# Patient Record
Sex: Male | Born: 1940
Health system: Southern US, Community
[De-identification: ages and names within clinical notes are randomized; demographics above are authoritative.]

## PROBLEM LIST (undated history)

## (undated) DIAGNOSIS — F0390 Unspecified dementia without behavioral disturbance: Secondary | ICD-10-CM

## (undated) DIAGNOSIS — I219 Acute myocardial infarction, unspecified: Secondary | ICD-10-CM

## (undated) DIAGNOSIS — I10 Essential (primary) hypertension: Secondary | ICD-10-CM

## (undated) HISTORY — DX: Acute myocardial infarction, unspecified: I21.9

---

## 2016-06-24 DIAGNOSIS — I10 Essential (primary) hypertension: Secondary | ICD-10-CM | POA: Diagnosis not present

## 2016-06-24 DIAGNOSIS — Z6829 Body mass index (BMI) 29.0-29.9, adult: Secondary | ICD-10-CM | POA: Diagnosis not present

## 2016-06-24 DIAGNOSIS — E785 Hyperlipidemia, unspecified: Secondary | ICD-10-CM | POA: Diagnosis not present

## 2016-06-24 DIAGNOSIS — F0390 Unspecified dementia without behavioral disturbance: Secondary | ICD-10-CM | POA: Diagnosis not present

## 2016-06-24 DIAGNOSIS — Z299 Encounter for prophylactic measures, unspecified: Secondary | ICD-10-CM | POA: Diagnosis not present

## 2016-08-04 DIAGNOSIS — K219 Gastro-esophageal reflux disease without esophagitis: Secondary | ICD-10-CM | POA: Diagnosis not present

## 2016-08-04 DIAGNOSIS — Z299 Encounter for prophylactic measures, unspecified: Secondary | ICD-10-CM | POA: Diagnosis not present

## 2016-08-04 DIAGNOSIS — R21 Rash and other nonspecific skin eruption: Secondary | ICD-10-CM | POA: Diagnosis not present

## 2016-08-04 DIAGNOSIS — E785 Hyperlipidemia, unspecified: Secondary | ICD-10-CM | POA: Diagnosis not present

## 2016-08-04 DIAGNOSIS — E663 Overweight: Secondary | ICD-10-CM | POA: Diagnosis not present

## 2016-08-04 DIAGNOSIS — F0390 Unspecified dementia without behavioral disturbance: Secondary | ICD-10-CM | POA: Diagnosis not present

## 2016-11-04 DIAGNOSIS — E785 Hyperlipidemia, unspecified: Secondary | ICD-10-CM | POA: Diagnosis not present

## 2016-11-04 DIAGNOSIS — Z299 Encounter for prophylactic measures, unspecified: Secondary | ICD-10-CM | POA: Diagnosis not present

## 2016-11-04 DIAGNOSIS — I10 Essential (primary) hypertension: Secondary | ICD-10-CM | POA: Diagnosis not present

## 2016-11-04 DIAGNOSIS — F0390 Unspecified dementia without behavioral disturbance: Secondary | ICD-10-CM | POA: Diagnosis not present

## 2016-11-04 DIAGNOSIS — Z789 Other specified health status: Secondary | ICD-10-CM | POA: Diagnosis not present

## 2016-11-04 DIAGNOSIS — Z6828 Body mass index (BMI) 28.0-28.9, adult: Secondary | ICD-10-CM | POA: Diagnosis not present

## 2016-12-06 DIAGNOSIS — Z6828 Body mass index (BMI) 28.0-28.9, adult: Secondary | ICD-10-CM | POA: Diagnosis not present

## 2016-12-06 DIAGNOSIS — I10 Essential (primary) hypertension: Secondary | ICD-10-CM | POA: Diagnosis not present

## 2016-12-06 DIAGNOSIS — L0291 Cutaneous abscess, unspecified: Secondary | ICD-10-CM | POA: Diagnosis not present

## 2016-12-06 DIAGNOSIS — E785 Hyperlipidemia, unspecified: Secondary | ICD-10-CM | POA: Diagnosis not present

## 2016-12-06 DIAGNOSIS — F0390 Unspecified dementia without behavioral disturbance: Secondary | ICD-10-CM | POA: Diagnosis not present

## 2016-12-06 DIAGNOSIS — Z299 Encounter for prophylactic measures, unspecified: Secondary | ICD-10-CM | POA: Diagnosis not present

## 2016-12-15 DIAGNOSIS — I10 Essential (primary) hypertension: Secondary | ICD-10-CM | POA: Diagnosis not present

## 2016-12-15 DIAGNOSIS — L02413 Cutaneous abscess of right upper limb: Secondary | ICD-10-CM | POA: Diagnosis not present

## 2016-12-17 DIAGNOSIS — L02413 Cutaneous abscess of right upper limb: Secondary | ICD-10-CM | POA: Diagnosis not present

## 2016-12-17 DIAGNOSIS — Z4800 Encounter for change or removal of nonsurgical wound dressing: Secondary | ICD-10-CM | POA: Diagnosis not present

## 2016-12-17 DIAGNOSIS — Z48817 Encounter for surgical aftercare following surgery on the skin and subcutaneous tissue: Secondary | ICD-10-CM | POA: Diagnosis not present

## 2017-03-06 DIAGNOSIS — Z6829 Body mass index (BMI) 29.0-29.9, adult: Secondary | ICD-10-CM | POA: Diagnosis not present

## 2017-03-06 DIAGNOSIS — F0280 Dementia in other diseases classified elsewhere without behavioral disturbance: Secondary | ICD-10-CM | POA: Diagnosis not present

## 2017-03-06 DIAGNOSIS — I10 Essential (primary) hypertension: Secondary | ICD-10-CM | POA: Diagnosis not present

## 2017-03-06 DIAGNOSIS — G309 Alzheimer's disease, unspecified: Secondary | ICD-10-CM | POA: Diagnosis not present

## 2017-03-06 DIAGNOSIS — Z299 Encounter for prophylactic measures, unspecified: Secondary | ICD-10-CM | POA: Diagnosis not present

## 2017-03-06 DIAGNOSIS — Z2821 Immunization not carried out because of patient refusal: Secondary | ICD-10-CM | POA: Diagnosis not present

## 2017-03-06 DIAGNOSIS — E785 Hyperlipidemia, unspecified: Secondary | ICD-10-CM | POA: Diagnosis not present

## 2017-03-06 DIAGNOSIS — F439 Reaction to severe stress, unspecified: Secondary | ICD-10-CM | POA: Diagnosis not present

## 2017-03-06 DIAGNOSIS — Z87891 Personal history of nicotine dependence: Secondary | ICD-10-CM | POA: Diagnosis not present

## 2017-06-06 DIAGNOSIS — I10 Essential (primary) hypertension: Secondary | ICD-10-CM | POA: Diagnosis not present

## 2017-06-06 DIAGNOSIS — G309 Alzheimer's disease, unspecified: Secondary | ICD-10-CM | POA: Diagnosis not present

## 2017-06-06 DIAGNOSIS — F0280 Dementia in other diseases classified elsewhere without behavioral disturbance: Secondary | ICD-10-CM | POA: Diagnosis not present

## 2017-06-06 DIAGNOSIS — Z299 Encounter for prophylactic measures, unspecified: Secondary | ICD-10-CM | POA: Diagnosis not present

## 2017-06-06 DIAGNOSIS — Z6829 Body mass index (BMI) 29.0-29.9, adult: Secondary | ICD-10-CM | POA: Diagnosis not present

## 2018-01-30 ENCOUNTER — Encounter (HOSPITAL_COMMUNITY)
Admission: RE | Disposition: A | Discharge status: Home / Self Care | Payer: Self-pay | Source: Home / Self Care | Attending: Cardiology

## 2018-01-30 ENCOUNTER — Encounter (HOSPITAL_COMMUNITY): Payer: Self-pay | Admitting: Internal Medicine

## 2018-01-30 ENCOUNTER — Inpatient Hospital Stay (HOSPITAL_COMMUNITY)
Admission: RE | Admit: 2018-01-30 | Discharge: 2018-02-02 | DRG: 250 | Disposition: A | Discharge status: Home / Self Care | Payer: Medicare Other | Attending: Cardiology | Admitting: Cardiology

## 2018-01-30 DIAGNOSIS — I255 Ischemic cardiomyopathy: Secondary | ICD-10-CM | POA: Diagnosis present

## 2018-01-30 DIAGNOSIS — F0390 Unspecified dementia without behavioral disturbance: Secondary | ICD-10-CM | POA: Diagnosis present

## 2018-01-30 DIAGNOSIS — I2119 ST elevation (STEMI) myocardial infarction involving other coronary artery of inferior wall: Principal | ICD-10-CM | POA: Diagnosis present

## 2018-01-30 DIAGNOSIS — T445X5A Adverse effect of predominantly beta-adrenoreceptor agonists, initial encounter: Secondary | ICD-10-CM | POA: Diagnosis not present

## 2018-01-30 DIAGNOSIS — R0689 Other abnormalities of breathing: Secondary | ICD-10-CM | POA: Diagnosis not present

## 2018-01-30 DIAGNOSIS — R21 Rash and other nonspecific skin eruption: Secondary | ICD-10-CM | POA: Diagnosis present

## 2018-01-30 DIAGNOSIS — E785 Hyperlipidemia, unspecified: Secondary | ICD-10-CM | POA: Diagnosis present

## 2018-01-30 DIAGNOSIS — I10 Essential (primary) hypertension: Secondary | ICD-10-CM | POA: Diagnosis not present

## 2018-01-30 DIAGNOSIS — R0902 Hypoxemia: Secondary | ICD-10-CM | POA: Diagnosis not present

## 2018-01-30 DIAGNOSIS — I472 Ventricular tachycardia: Secondary | ICD-10-CM | POA: Diagnosis present

## 2018-01-30 DIAGNOSIS — R0789 Other chest pain: Secondary | ICD-10-CM | POA: Diagnosis not present

## 2018-01-30 DIAGNOSIS — I959 Hypotension, unspecified: Secondary | ICD-10-CM | POA: Diagnosis not present

## 2018-01-30 DIAGNOSIS — I25119 Atherosclerotic heart disease of native coronary artery with unspecified angina pectoris: Secondary | ICD-10-CM | POA: Diagnosis present

## 2018-01-30 DIAGNOSIS — I110 Hypertensive heart disease with heart failure: Secondary | ICD-10-CM | POA: Diagnosis present

## 2018-01-30 DIAGNOSIS — I5021 Acute systolic (congestive) heart failure: Secondary | ICD-10-CM | POA: Diagnosis present

## 2018-01-30 DIAGNOSIS — Z9861 Coronary angioplasty status: Secondary | ICD-10-CM

## 2018-01-30 DIAGNOSIS — Z87891 Personal history of nicotine dependence: Secondary | ICD-10-CM | POA: Diagnosis not present

## 2018-01-30 DIAGNOSIS — I4729 Other ventricular tachycardia: Secondary | ICD-10-CM

## 2018-01-30 DIAGNOSIS — I2111 ST elevation (STEMI) myocardial infarction involving right coronary artery: Secondary | ICD-10-CM | POA: Diagnosis not present

## 2018-01-30 DIAGNOSIS — I2510 Atherosclerotic heart disease of native coronary artery without angina pectoris: Secondary | ICD-10-CM | POA: Clinically undetermined

## 2018-01-30 DIAGNOSIS — I213 ST elevation (STEMI) myocardial infarction of unspecified site: Secondary | ICD-10-CM | POA: Diagnosis not present

## 2018-01-30 DIAGNOSIS — I2109 ST elevation (STEMI) myocardial infarction involving other coronary artery of anterior wall: Secondary | ICD-10-CM | POA: Diagnosis not present

## 2018-01-30 DIAGNOSIS — R079 Chest pain, unspecified: Secondary | ICD-10-CM | POA: Diagnosis not present

## 2018-01-30 HISTORY — DX: Unspecified dementia, unspecified severity, without behavioral disturbance, psychotic disturbance, mood disturbance, and anxiety: F03.90

## 2018-01-30 HISTORY — PX: LEFT HEART CATH AND CORONARY ANGIOGRAPHY: CATH118249

## 2018-01-30 HISTORY — DX: Essential (primary) hypertension: I10

## 2018-01-30 HISTORY — PX: CORONARY/GRAFT ACUTE MI REVASCULARIZATION: CATH118305

## 2018-01-30 LAB — HEMOGLOBIN A1C
HEMOGLOBIN A1C: 5.3 % (ref 4.8–5.6)
Mean Plasma Glucose: 105.41 mg/dL

## 2018-01-30 LAB — COMPREHENSIVE METABOLIC PANEL
ALK PHOS: 76 U/L (ref 38–126)
ALT: 21 U/L (ref 0–44)
AST: 22 U/L (ref 15–41)
Albumin: 3.5 g/dL (ref 3.5–5.0)
Anion gap: 9 (ref 5–15)
BUN: 17 mg/dL (ref 8–23)
CO2: 22 mmol/L (ref 22–32)
Calcium: 9.1 mg/dL (ref 8.9–10.3)
Chloride: 106 mmol/L (ref 98–111)
Creatinine, Ser: 0.89 mg/dL (ref 0.61–1.24)
GFR calc Af Amer: >60 mL/min (ref >60)
GFR calc non Af Amer: >60 mL/min (ref >60)
Glucose, Bld: 161 mg/dL — ABNORMAL HIGH (ref 70–99)
Potassium: 3.9 mmol/L (ref 3.5–5.1)
Sodium: 137 mmol/L (ref 135–145)
Total Bilirubin: 0.7 mg/dL (ref 0.3–1.2)
Total Protein: 6.5 g/dL (ref 6.5–8.1)

## 2018-01-30 LAB — PROTIME-INR
INR: 1.16
Prothrombin Time: 14.7 seconds (ref 11.4–15.2)

## 2018-01-30 LAB — CBC
HCT: 43.1 % (ref 39.0–52.0)
Hemoglobin: 14.5 g/dL (ref 13.0–17.0)
MCH: 29.4 pg (ref 26.0–34.0)
MCHC: 33.6 g/dL (ref 30.0–36.0)
MCV: 87.4 fL (ref 80.0–100.0)
Platelets: 185 10*3/uL (ref 150–400)
RBC: 4.93 MIL/uL (ref 4.22–5.81)
RDW: 13 % (ref 11.5–15.5)
WBC: 9.6 10*3/uL (ref 4.0–10.5)
nRBC: 0 % (ref 0.0–0.2)

## 2018-01-30 LAB — LIPID PANEL
Cholesterol: 148 mg/dL (ref 0–200)
HDL: 46 mg/dL (ref >40)
LDL Cholesterol: 96 mg/dL (ref 0–99)
Total CHOL/HDL Ratio: 3.2 RATIO
Triglycerides: 32 mg/dL (ref <150)
VLDL: 6 mg/dL (ref 0–40)

## 2018-01-30 LAB — APTT: aPTT: 100 seconds — ABNORMAL HIGH (ref 24–36)

## 2018-01-30 LAB — TROPONIN I: Troponin I: 0.08 ng/mL (ref <0.03)

## 2018-01-30 SURGERY — CORONARY/GRAFT ACUTE MI REVASCULARIZATION
Anesthesia: LOCAL

## 2018-01-30 MED ORDER — TICAGRELOR 90 MG PO TABS
ORAL_TABLET | ORAL | Status: DC | PRN
  Administered 2018-01-30: 180 mg via ORAL

## 2018-01-30 MED ORDER — LOSARTAN POTASSIUM 50 MG PO TABS
50.0000 mg | ORAL_TABLET | Freq: Every day | ORAL | Status: DC
  Administered 2018-01-31 – 2018-02-01 (×2): 50 mg via ORAL
  Filled 2018-01-30 (×2): qty 1

## 2018-01-30 MED ORDER — ATORVASTATIN CALCIUM 80 MG PO TABS
80.0000 mg | ORAL_TABLET | Freq: Every day | ORAL | Status: DC
  Administered 2018-01-31 – 2018-02-01 (×2): 80 mg via ORAL
  Filled 2018-01-30 (×2): qty 1

## 2018-01-30 MED ORDER — SODIUM CHLORIDE 0.9% FLUSH
3.0000 mL | INTRAVENOUS | Status: DC | PRN
  Administered 2018-01-31: 3 mL via INTRAVENOUS
  Filled 2018-01-30: qty 3

## 2018-01-30 MED ORDER — TIROFIBAN HCL IN NACL 5-0.9 MG/100ML-% IV SOLN
INTRAVENOUS | Status: AC
  Filled 2018-01-30: qty 100

## 2018-01-30 MED ORDER — IOHEXOL 350 MG/ML SOLN
INTRAVENOUS | Status: DC | PRN
  Administered 2018-01-30: 105 mL via INTRA_ARTERIAL

## 2018-01-30 MED ORDER — TIROFIBAN HCL IN NACL 5-0.9 MG/100ML-% IV SOLN
INTRAVENOUS | Status: AC | PRN
  Administered 2018-01-30: 0.15 ug/kg/min via INTRAVENOUS

## 2018-01-30 MED ORDER — METOPROLOL TARTRATE 12.5 MG HALF TABLET
12.5000 mg | ORAL_TABLET | Freq: Two times a day (BID) | ORAL | Status: AC
  Administered 2018-01-30 – 2018-02-01 (×5): 12.5 mg via ORAL
  Filled 2018-01-30 (×5): qty 1

## 2018-01-30 MED ORDER — HEPARIN (PORCINE) IN NACL 1000-0.9 UT/500ML-% IV SOLN
INTRAVENOUS | Status: AC
  Filled 2018-01-30: qty 1000

## 2018-01-30 MED ORDER — FENTANYL CITRATE (PF) 100 MCG/2ML IJ SOLN
INTRAMUSCULAR | Status: AC
  Filled 2018-01-30: qty 2

## 2018-01-30 MED ORDER — ONDANSETRON HCL 4 MG/2ML IJ SOLN
INTRAMUSCULAR | Status: AC
  Filled 2018-01-30: qty 2

## 2018-01-30 MED ORDER — MEMANTINE HCL 10 MG PO TABS
10.0000 mg | ORAL_TABLET | Freq: Every day | ORAL | Status: DC
  Administered 2018-01-31 – 2018-02-02 (×3): 10 mg via ORAL
  Filled 2018-01-30 (×3): qty 1

## 2018-01-30 MED ORDER — FENTANYL CITRATE (PF) 100 MCG/2ML IJ SOLN
INTRAMUSCULAR | Status: DC | PRN
  Administered 2018-01-30: 25 ug via INTRAVENOUS

## 2018-01-30 MED ORDER — NYSTATIN 100000 UNIT/GM EX CREA
TOPICAL_CREAM | Freq: Two times a day (BID) | CUTANEOUS | Status: DC
  Administered 2018-01-30: 1 via TOPICAL
  Administered 2018-01-31: 09:00:00 via TOPICAL
  Filled 2018-01-30: qty 15

## 2018-01-30 MED ORDER — NITROGLYCERIN 1 MG/10 ML FOR IR/CATH LAB
INTRA_ARTERIAL | Status: DC | PRN
  Administered 2018-01-30: 200 ug via INTRACORONARY

## 2018-01-30 MED ORDER — TIROFIBAN (AGGRASTAT) BOLUS VIA INFUSION
INTRAVENOUS | Status: DC | PRN
  Administered 2018-01-30: 2400 ug via INTRAVENOUS

## 2018-01-30 MED ORDER — LIDOCAINE HCL (PF) 1 % IJ SOLN
INTRAMUSCULAR | Status: DC | PRN
  Administered 2018-01-30: 2 mL via INTRADERMAL

## 2018-01-30 MED ORDER — LIDOCAINE HCL (PF) 1 % IJ SOLN
INTRAMUSCULAR | Status: AC
  Filled 2018-01-30: qty 30

## 2018-01-30 MED ORDER — ONDANSETRON HCL 4 MG/2ML IJ SOLN: 4.0000 mg | Freq: Four times a day (QID) | INTRAMUSCULAR | Status: DC | PRN

## 2018-01-30 MED ORDER — VERAPAMIL HCL 2.5 MG/ML IV SOLN
INTRAVENOUS | Status: DC | PRN
  Administered 2018-01-30: 10 mL via INTRA_ARTERIAL

## 2018-01-30 MED ORDER — TICAGRELOR 90 MG PO TABS
ORAL_TABLET | ORAL | Status: AC
  Filled 2018-01-30: qty 2

## 2018-01-30 MED ORDER — ONDANSETRON HCL 4 MG/2ML IJ SOLN
INTRAMUSCULAR | Status: DC | PRN
  Administered 2018-01-30: 4 mg via INTRAVENOUS

## 2018-01-30 MED ORDER — VERAPAMIL HCL 2.5 MG/ML IV SOLN
INTRAVENOUS | Status: AC
  Filled 2018-01-30: qty 2

## 2018-01-30 MED ORDER — DONEPEZIL HCL 10 MG PO TABS
10.0000 mg | ORAL_TABLET | Freq: Every day | ORAL | Status: DC
  Administered 2018-01-31 – 2018-02-01 (×3): 10 mg via ORAL
  Filled 2018-01-30 (×4): qty 1

## 2018-01-30 MED ORDER — PNEUMOCOCCAL VAC POLYVALENT 25 MCG/0.5ML IJ INJ: 0.5000 mL | INJECTION | INTRAMUSCULAR | Status: DC

## 2018-01-30 MED ORDER — ENOXAPARIN SODIUM 40 MG/0.4ML ~~LOC~~ SOLN
40.0000 mg | SUBCUTANEOUS | Status: DC
  Administered 2018-01-31 – 2018-02-01 (×2): 40 mg via SUBCUTANEOUS
  Filled 2018-01-30 (×2): qty 0.4

## 2018-01-30 MED ORDER — NITROGLYCERIN 1 MG/10 ML FOR IR/CATH LAB
INTRA_ARTERIAL | Status: AC
  Filled 2018-01-30: qty 10

## 2018-01-30 MED ORDER — HEPARIN (PORCINE) IN NACL 1000-0.9 UT/500ML-% IV SOLN
INTRAVENOUS | Status: DC | PRN
  Administered 2018-01-30 (×2): 500 mL

## 2018-01-30 MED ORDER — TICAGRELOR 90 MG PO TABS
90.0000 mg | ORAL_TABLET | Freq: Two times a day (BID) | ORAL | Status: DC
  Administered 2018-01-31 – 2018-02-02 (×5): 90 mg via ORAL
  Filled 2018-01-30 (×5): qty 1

## 2018-01-30 MED ORDER — ACETAMINOPHEN 325 MG PO TABS
650.0000 mg | ORAL_TABLET | ORAL | Status: DC | PRN
  Administered 2018-01-31: 650 mg via ORAL
  Filled 2018-01-30: qty 2

## 2018-01-30 MED ORDER — SODIUM CHLORIDE 0.9 % IV SOLN: 250.0000 mL | INTRAVENOUS | Status: DC | PRN

## 2018-01-30 MED ORDER — SODIUM CHLORIDE 0.9% FLUSH
3.0000 mL | Freq: Two times a day (BID) | INTRAVENOUS | Status: DC
  Administered 2018-01-31 – 2018-02-01 (×4): 3 mL via INTRAVENOUS

## 2018-01-30 MED ORDER — HEPARIN SODIUM (PORCINE) 1000 UNIT/ML IJ SOLN
INTRAMUSCULAR | Status: DC | PRN
  Administered 2018-01-30: 7000 [IU] via INTRAVENOUS

## 2018-01-30 MED ORDER — TIROFIBAN HCL IN NACL 5-0.9 MG/100ML-% IV SOLN
0.1500 ug/kg/min | INTRAVENOUS | Status: DC
  Administered 2018-01-30: 0.15 ug/kg/min via INTRAVENOUS
  Filled 2018-01-30: qty 100

## 2018-01-30 MED ORDER — SODIUM CHLORIDE 0.9 % WEIGHT BASED INFUSION
1.0000 mL/kg/h | INTRAVENOUS | Status: DC
  Administered 2018-01-30: 1 mL/kg/h via INTRAVENOUS

## 2018-01-30 MED ORDER — HEPARIN SODIUM (PORCINE) 1000 UNIT/ML IJ SOLN
INTRAMUSCULAR | Status: AC
  Filled 2018-01-30: qty 1

## 2018-01-30 MED ORDER — INFLUENZA VAC SPLIT HIGH-DOSE 0.5 ML IM SUSY
0.5000 mL | PREFILLED_SYRINGE | INTRAMUSCULAR | Status: DC
  Filled 2018-01-30: qty 0.5

## 2018-01-30 MED ORDER — ONDANSETRON HCL 4 MG/2ML IJ SOLN
INTRAMUSCULAR | Status: AC
Start: 2018-01-30 — End: ?
  Filled 2018-01-30: qty 2

## 2018-01-30 MED ORDER — ASPIRIN 81 MG PO CHEW
81.0000 mg | CHEWABLE_TABLET | Freq: Every day | ORAL | Status: DC
  Administered 2018-01-31 – 2018-02-02 (×3): 81 mg via ORAL
  Filled 2018-01-30 (×3): qty 1

## 2018-01-30 SURGICAL SUPPLY — 17 items
BALLN SAPPHIRE 2.0X15 (BALLOONS) ×2
BALLN SAPPHIRE 2.5X15 (BALLOONS) ×2
BALLOON SAPPHIRE 2.0X15 (BALLOONS) ×1 IMPLANT
BALLOON SAPPHIRE 2.5X15 (BALLOONS) ×1 IMPLANT
CATH 5FR JL3.5 JR4 ANG PIG MP (CATHETERS) ×2 IMPLANT
CATH LAUNCHER 6FR JR4 (CATHETERS) ×2 IMPLANT
DEVICE RAD COMP TR BAND LRG (VASCULAR PRODUCTS) ×2 IMPLANT
GLIDESHEATH SLEND SS 6F .021 (SHEATH) ×2 IMPLANT
GUIDEWIRE INQWIRE 1.5J.035X260 (WIRE) ×1 IMPLANT
INQWIRE 1.5J .035X260CM (WIRE) ×2
KIT ENCORE 26 ADVANTAGE (KITS) ×2 IMPLANT
KIT HEART LEFT (KITS) ×2 IMPLANT
PACK CARDIAC CATHETERIZATION (CUSTOM PROCEDURE TRAY) ×2 IMPLANT
TRANSDUCER W/STOPCOCK (MISCELLANEOUS) ×2 IMPLANT
TUBING CIL FLEX 10 FLL-RA (TUBING) ×2 IMPLANT
WIRE ASAHI PROWATER 180CM (WIRE) ×2 IMPLANT
WIRE HI TORQ VERSACORE-J 145CM (WIRE) ×2 IMPLANT

## 2018-01-30 NOTE — Plan of Care (Signed)
  Problem: Education: Goal: Understanding of cardiac disease, CV risk reduction, and recovery process will improve Outcome: Progressing   Problem: Activity: Goal: Ability to tolerate increased activity will improve Outcome: Progressing   Problem: Cardiac: Goal: Ability to achieve and maintain adequate cardiovascular perfusion will improve Outcome: Progressing   Per family, patient has advanced dementia. Patient's family educated and understands plan of care.

## 2018-01-30 NOTE — H&P (Signed)
Cardiology History & Physical    Patient ID: [name] MRN: [id number], DOB: 03/31/1940 Date of Encounter: 01/30/2018, 9:44 PM Primary Physician: No primary care provider on file.  Chief Complaint: STEMI   HPI: [name] is a 78 y.o. male with history of HTN, former tobacco use, dementia presented to OSH with intermittent sharp, central substernal chest pain occurring throughout the day.  He alerted his son who brought him to the [facility] ER where he was noted to have an inferior STEMI.  On arrival to [facility], the patient states that he feels well and denies CP, SOB, but does endorse some moderate indigestion type sensation in his central chest.  He denies any history of CAD, DM, HL, bleeding disorder or stroke.History is limited by patient's dementia and some information taken from OSH chart.  He took ASA prior to arrival to the ED and at the OSH he was given 5k units of heparin.  He was given 180mg  ticagrelor in route to the cath lab where he was taken for emergent revascularization.  History reviewed. No pertinent past medical history.   Surgical History: History reviewed. No pertinent surgical history.   Home Meds: Prior to Admission medications   Not on File    Allergies: Allergies not on file  Social History   Socioeconomic History  . Marital status: Not on file    Spouse name: Not on file  . Number of children: Not on file  . Years of education: Not on file  . Highest education level: Not on file  Occupational History  . Not on file  Social Needs  . Financial resource strain: Not on file  . Food insecurity:    Worry: Not on file    Inability: Not on file  . Transportation needs:    Medical: Not on file    Non-medical: Not on file  Tobacco Use  . Smoking status: Not on file  Substance and Sexual Activity  . Alcohol use: Not on file  . Drug use: Not on file  . Sexual activity: Not on file  Lifestyle  . Physical activity:    Days per week: Not  on file    Minutes per session: Not on file  . Stress: Not on file  Relationships  . Social connections:    Talks on phone: Not on file    Gets together: Not on file    Attends religious service: Not on file    Active member of club or organization: Not on file    Attends meetings of clubs or organizations: Not on file    Relationship status: Not on file  . Intimate partner violence:    Fear of current or ex partner: Not on file    Emotionally abused: Not on file    Physically abused: Not on file    Forced sexual activity: Not on file  Other Topics Concern  . Not on file  Social History Narrative  . Not on file     History reviewed. No pertinent family history.  Review of Systems: All other systems reviewed and are otherwise negative except as noted above.  Labs:  No results found for: WBC, HGB, HCT, MCV, PLT No results for input(s): NA, K, CL, CO2, BUN, CREATININE, CALCIUM, PROT, BILITOT, ALKPHOS, ALT, AST, GLUCOSE in the last 168 hours.  Invalid input(s): LABALBU No results for input(s): CKTOTAL, CKMB, TROPONINI in the last 72 hours. No results found for: CHOL, HDL, LDLCALC, TRIG No results  found for: DDIMER  Radiology/Studies:  No results found. Wt Readings from Last 3 Encounters:  01/30/18 96 kg    EKG: NSR with 2-54mm of STE in leads II, III avF, reciprocol ST dperession in V1 and V2   Physical Exam: Height 5\' 10"  (1.778 m), weight 96 kg. Body mass index is 30.37 kg/m. General: Well developed, well nourished, in no acute distress. Head: Normocephalic, atraumatic, sclera non-icteric, no xanthomas, nares are without discharge.  Neck: Negative for carotid bruits. JVD not elevated. Lungs: Clear bilaterally to auscultation without wheezes, rales, or rhonchi. Breathing is unlabored. Heart: RRR with S1 S2. No murmurs, rubs, or gallops appreciated. Abdomen: Soft, non-tender, non-distended with normoactive bowel sounds. No hepatomegaly. No rebound/guarding. No obvious  abdominal masses. Msk:  Strength and tone appear normal for age. Extremities: No clubbing or cyanosis. No edema.  Distal pedal pulses are 2+ and equal bilaterally. Neuro: Alert and oriented X 3. No focal deficit. No facial asymmetry. Moves all extremities spontaneously. Psych:  Responds to questions appropriately with a normal affect.    Assessment and Plan   [name] is a 78 y.o. male with history of HTN, former tobacco use, dementia presented to OSH with an inferior STEMI  #Inferior ST elevation MI: Patient presented with intermitent CP throughout the day and has known risk factors for CAD including HTN and history of tobacco use.  ECG demonstrated clear ST elevation in the inferior leads so he was taken emergently for revascularization.  LHC demonstrated occlusion in the distal RCA at the bifurcation of the PL and PDA that was treated with POBA given proximity to bifurcation and patient becoming more aggitated during the procedure. LVEDP measured at 17. The patient was loaded with P2Y12 inhibitor prior to arrival in the cath lab. -12 lead post cath -TTE in am -Lipids, A1C -tirofiban for 4 hours post cath -ASA, ticagrelor -Atorva 80 -Start metop 12.5 BID -cardiac rehan at discharge  #Groin rash: Patient with bilateral erythema in inguinal creases concerning for fungal infection -nystatn cream  #HTN: Patient on losartan as an outpatient, BP currently controlled -will continue losartan -adding metoprolol per above  #Dementia: Patient at baseline per family -continue donezepil -continue memantine   Signed, Lujean Amel, MD 01/30/2018, 9:44 PM

## 2018-01-31 ENCOUNTER — Other Ambulatory Visit: Payer: Self-pay

## 2018-01-31 ENCOUNTER — Encounter (HOSPITAL_COMMUNITY): Payer: Self-pay

## 2018-01-31 ENCOUNTER — Inpatient Hospital Stay (HOSPITAL_COMMUNITY): Payer: Medicare Other

## 2018-01-31 DIAGNOSIS — I2119 ST elevation (STEMI) myocardial infarction involving other coronary artery of inferior wall: Secondary | ICD-10-CM

## 2018-01-31 DIAGNOSIS — I10 Essential (primary) hypertension: Secondary | ICD-10-CM

## 2018-01-31 DIAGNOSIS — I2510 Atherosclerotic heart disease of native coronary artery without angina pectoris: Secondary | ICD-10-CM | POA: Clinically undetermined

## 2018-01-31 DIAGNOSIS — E785 Hyperlipidemia, unspecified: Secondary | ICD-10-CM | POA: Diagnosis present

## 2018-01-31 DIAGNOSIS — I255 Ischemic cardiomyopathy: Secondary | ICD-10-CM

## 2018-01-31 DIAGNOSIS — I4729 Other ventricular tachycardia: Secondary | ICD-10-CM

## 2018-01-31 DIAGNOSIS — I472 Ventricular tachycardia: Secondary | ICD-10-CM

## 2018-01-31 DIAGNOSIS — R21 Rash and other nonspecific skin eruption: Secondary | ICD-10-CM | POA: Diagnosis present

## 2018-01-31 LAB — POCT I-STAT 7, (LYTES, BLD GAS, ICA,H+H)
Acid-base deficit: 1 mmol/L (ref 0.0–2.0)
Bicarbonate: 23.6 mmol/L (ref 20.0–28.0)
Calcium, Ion: 1.24 mmol/L (ref 1.15–1.40)
HCT: 42 % (ref 39.0–52.0)
Hemoglobin: 14.3 g/dL (ref 13.0–17.0)
O2 Saturation: 99 %
Potassium: 3.9 mmol/L (ref 3.5–5.1)
Sodium: 137 mmol/L (ref 135–145)
TCO2: 25 mmol/L (ref 22–32)
pCO2 arterial: 39.6 mmHg (ref 32.0–48.0)
pH, Arterial: 7.383 (ref 7.350–7.450)
pO2, Arterial: 137 mmHg — ABNORMAL HIGH (ref 83.0–108.0)

## 2018-01-31 LAB — BASIC METABOLIC PANEL
Anion gap: 9 (ref 5–15)
BUN: 14 mg/dL (ref 8–23)
CO2: 24 mmol/L (ref 22–32)
Calcium: 8.8 mg/dL — ABNORMAL LOW (ref 8.9–10.3)
Chloride: 107 mmol/L (ref 98–111)
Creatinine, Ser: 0.85 mg/dL (ref 0.61–1.24)
GFR calc Af Amer: >60 mL/min (ref >60)
GFR calc non Af Amer: >60 mL/min (ref >60)
Glucose, Bld: 115 mg/dL — ABNORMAL HIGH (ref 70–99)
Potassium: 3.8 mmol/L (ref 3.5–5.1)
Sodium: 140 mmol/L (ref 135–145)

## 2018-01-31 LAB — CBC
HCT: 43.9 % (ref 39.0–52.0)
HEMOGLOBIN: 14.4 g/dL (ref 13.0–17.0)
MCH: 29 pg (ref 26.0–34.0)
MCHC: 32.8 g/dL (ref 30.0–36.0)
MCV: 88.3 fL (ref 80.0–100.0)
Platelets: 187 10*3/uL (ref 150–400)
RBC: 4.97 MIL/uL (ref 4.22–5.81)
RDW: 13.1 % (ref 11.5–15.5)
WBC: 8.2 10*3/uL (ref 4.0–10.5)
nRBC: 0 % (ref 0.0–0.2)

## 2018-01-31 LAB — MAGNESIUM: Magnesium: 2 mg/dL (ref 1.7–2.4)

## 2018-01-31 LAB — MRSA PCR SCREENING: MRSA by PCR: NEGATIVE

## 2018-01-31 MED ORDER — HYDROCORTISONE 1 % EX CREA
TOPICAL_CREAM | Freq: Three times a day (TID) | CUTANEOUS | Status: DC
  Administered 2018-01-31 – 2018-02-01 (×4): via TOPICAL
  Administered 2018-02-01: 1 via TOPICAL
  Filled 2018-01-31: qty 28

## 2018-01-31 MED ORDER — INFLUENZA VAC SPLIT HIGH-DOSE 0.5 ML IM SUSY: 0.5000 mL | PREFILLED_SYRINGE | INTRAMUSCULAR | Status: DC | PRN

## 2018-01-31 MED ORDER — NYSTATIN 100000 UNIT/GM EX POWD
Freq: Two times a day (BID) | CUTANEOUS | Status: DC
  Administered 2018-01-31: 16:00:00 via TOPICAL
  Administered 2018-01-31: 1 via TOPICAL
  Administered 2018-02-01 – 2018-02-02 (×3): via TOPICAL
  Filled 2018-01-31: qty 15

## 2018-01-31 MED ORDER — PNEUMOCOCCAL VAC POLYVALENT 25 MCG/0.5ML IJ INJ: 0.5000 mL | INJECTION | INTRAMUSCULAR | Status: DC | PRN

## 2018-01-31 MED ORDER — RISPERIDONE 0.25 MG PO TABS
0.2500 mg | ORAL_TABLET | Freq: Two times a day (BID) | ORAL | Status: DC
  Administered 2018-01-31 – 2018-02-01 (×3): 0.25 mg via ORAL
  Filled 2018-01-31 (×4): qty 1

## 2018-01-31 NOTE — Care Management (Signed)
CM will submit for Brilinta benefits check once patients health benefits are updated in Epic.  Midge Minium RN, BSN, NCM-BC, ACM-RN [phone]

## 2018-01-31 NOTE — Progress Notes (Signed)
  Echocardiogram 2D Echocardiogram has been performed.  [name] 01/31/2018, 12:14 PM

## 2018-01-31 NOTE — Progress Notes (Signed)
EKG CRITICAL VALUE     12 lead EKG performed.  Critical value noted.  Barbera Setters, RN notified.   Genia Plants, CCT 01/31/2018 7:29 AM

## 2018-01-31 NOTE — Progress Notes (Signed)
CRITICAL VALUE ALERT  Critical Value: 7 beat run of vtach, 11 beat run of vtach.   Provider Notified: 01/31/2018 0115 Dr. Delsa Sale  Orders Received/Actions taken: No orders received.

## 2018-01-31 NOTE — Progress Notes (Signed)
Progress Note  Patient Name: [name] Date of Encounter: 01/31/2018  Primary Cardiologist: No primary care provider on file. ~[place]  Subjective   Awake and alert, sitting up eating breakfast.  No complaints of chest pain or pressure.  No dyspnea.  Inpatient Medications    Scheduled Meds: . aspirin  81 mg Oral Daily  . atorvastatin  80 mg Oral q1800  . donepezil  10 mg Oral QHS  . enoxaparin (LOVENOX) injection  40 mg Subcutaneous Q24H  . losartan  50 mg Oral Daily  . memantine  10 mg Oral Daily  . metoprolol tartrate  12.5 mg Oral BID  . nystatin cream   Topical BID  . sodium chloride flush  3 mL Intravenous Q12H  . ticagrelor  90 mg Oral BID   Continuous Infusions: . sodium chloride     PRN Meds: sodium chloride, acetaminophen, [START ON 02/01/2018] Influenza vac split quadrivalent PF, ondansetron (ZOFRAN) IV, pneumococcal 23 valent vaccine, sodium chloride flush   Vital Signs    Vitals:   01/31/18 0800 01/31/18 0900 01/31/18 1000 01/31/18 1117  BP: 140/84 (!) 148/101 (!) 130/95   Pulse:      Resp: 15 18 18    Temp:    97.6 F (36.4 C)  TempSrc:    Oral  SpO2: 99% 98% 100%   Weight:      Height:        Intake/Output Summary (Last 24 hours) at 01/31/2018 1219 Last data filed at 01/31/2018 1000 Gross per 24 hour  Intake 987 ml  Output 600 ml  Net 387 ml   Last 3 Weights 01/30/2018 01/30/2018  Weight (lbs) 211 lb 10.3 oz 211 lb 10.3 oz  Weight (kg) 96 kg 96 kg      Telemetry    Mostly sinus bradycardia in the high 40s to low 60s with several short bursts of VT but at least 2 critical values of 7 beat run of vtach, 11 beat run of vtach. - Personally Reviewed  ECG    Sinus bradycardia, rate 55 bpm.  PVC noted.  Recent inferior infarct (evolutionary changes -Q waves in II, III and aVF as well as inverted T waves with <mm ST elevations)- Personally Reviewed (I suspect that his EMS EKG had leads II and III reversed, but otherwise stable or  evolutionary change with reduced ST elevations in the inferior leads now.).  Physical Exam   Physical Exam  Constitutional: He appears well-developed and well-nourished. No distress.  HENT:  Head: Normocephalic and atraumatic.  Mouth/Throat: Oropharynx is clear and moist.  Neck: No hepatojugular reflux and no JVD present. Carotid bruit is not present.  Cardiovascular: Regular rhythm, S1 normal, S2 normal and normal pulses.  Occasional extrasystoles are present. Bradycardia present. PMI is not displaced. Exam reveals distant heart sounds. Exam reveals no gallop and no friction rub.  No murmur heard. Pulmonary/Chest: Effort normal and breath sounds normal. No respiratory distress. He has no wheezes.  Mildly diminished basilar breath sounds but no rales or rhonchi.  Musculoskeletal: Normal range of motion.        General: No edema.  Neurological: He is alert.  Oriented to person and place  Skin:  Right radial bruising, no hematoma  Psychiatric: He has a normal mood and affect. His behavior is normal.  Nursing note and vitals reviewed.   Labs    Chemistry Recent Labs  Lab 01/30/18 2136 01/31/18 0733  NA 137 140  K 3.9 3.8  CL 106 107  CO2 22 24  GLUCOSE 161* 115*  BUN 17 14  CREATININE 0.89 0.85  CALCIUM 9.1 8.8*  PROT 6.5  --   ALBUMIN 3.5  --   AST 22  --   ALT 21  --   ALKPHOS 76  --   BILITOT 0.7  --   GFRNONAA >60 >60  GFRAA >60 >60  ANIONGAP 9 9     Hematology Recent Labs  Lab 01/30/18 2136 01/31/18 0733  WBC 9.6 8.2  RBC 4.93 4.97  HGB 14.5 14.4  HCT 43.1 43.9  MCV 87.4 88.3  MCH 29.4 29.0  MCHC 33.6 32.8  RDW 13.0 13.1  PLT 185 187    Cardiac Enzymes Recent Labs  Lab 01/30/18 2136  TROPONINI 0.08*   No results for input(s): TROPIPOC in the last 168 hours.   BNPNo results for input(s): BNP, PROBNP in the last 168 hours.   DDimer No results for input(s): DDIMER in the last 168 hours.   Radiology    No results found.  Cardiac Studies      Cath-PCI 01/30/2017: 50%-100% dRCA - PTCA only - 25% residual (no stent b/c pt to aggitated). dLA-ost LAD ~80% (irregular), pLAD 75% & p-mLAD 80%, D1 60%. dCx 50%. Normal LVEDP.  Echo Pending:  Patient Profile     78 y.o. male with underlying dementia & HTN & former smoker who presented via [facility] with EKG c/w Inferior STEMI. --> CATH with PTCA only on dRCA (no stent due to patient aggitation).   Has severe LCA disease - per Dr. Martinique - Med Rx.  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  Now on aspirin plus Brilinta.  High-dose statin ordered along with low-dose beta-blocker.  2D echo pending  Cardiac rehab as tolerated  Plan is to medically manage the existing LCA disease as he was relatively asymptomatic prior to this presentation.  Would not be a good surgical candidate.    NSVT -we will continue low-dose beta-blocker, but I suspect that most of the episodes last night were related to his MI.  Nothing prolonged.  Echocardiogram suggests cardiomyopathy with a EF of probably 35 to 40%.  Clearly inferior but also septal hypokinesis. -Not unexpected given his coronary disease, but one would suggest that there has had some chronic ischemic disease.  Is already on Cozaar, follow blood pressure now that we have added low-dose beta-blocker.  Seems euvolemic on exam.  No requirement for diuretic     Essential hypertension -relatively stable now on home dose of ARB plus low-dose metoprolol.    Hyperlipidemia with target LDL less than 70 -high-dose statin    Rash of groin -nystatin cream  Dementia: Continue Aricept and Namenda.  Smoking cessation counseling  For questions or updates, please contact [place] Please consult [email or web address] for contact info under        Signed, Glenetta Hew, MD  01/31/2018, 12:19 PM

## 2018-01-31 NOTE — Progress Notes (Signed)
End of shift note:  Patient responded very well to Risperdal without any negative side effects. I am truly very concerned about him being unsupervised at home for even short periods of time. The family reports his decrease in appetite, to the point of only eating a few bites each meal. He also was very dirty when bathed today and family reports he baths himself. Will consult social work and case management.

## 2018-01-31 NOTE — Progress Notes (Signed)
   01/31/18 0200  Clinical Encounter Type  Visited With Other (Comment) (ED and cath lab)  Visit Type Initial;Code;Other (Comment) (STEMI)  Referral From Other (Comment) (STEMI pg)   Pg rcvd 8:22pm, eta 39m.  Called ED at 9:11pm to see if pt had arrived.  Pt had, but no family known to be present.  Called cath lab and they expected family to come--let them know to pg chaplain when arrived.  Attempted to call ED front desk to let them know to pg chaplain to walk family up to [facility].  Multiple attempts but all calls routed back to ED bridge.  No pg rcvd as of 2:12am. Myra Gianotti resident, [phone]

## 2018-01-31 NOTE — Progress Notes (Signed)
While giving patient a bath I noticed a red rash on the patients back and abdomen. I asked his family if they have ever seen this rash and they said they haven't. I notified pharmacy to see if it could be a potential reaction to a new medication. Hydrocortisone cream was ordered and applied and hypoallergenic linen was ordered and put on. Will continue to monitor closely.

## 2018-01-31 NOTE — Plan of Care (Signed)
  Problem: Education: Goal: Understanding of cardiac disease, CV risk reduction, and recovery process will improve Outcome: Progressing   Problem: Activity: Goal: Ability to tolerate increased activity will improve Outcome: Progressing   Problem: Cardiac: Goal: Ability to achieve and maintain adequate cardiovascular perfusion will improve Outcome: Progressing   Problem: Nutrition: Goal: Adequate nutrition will be maintained Outcome: Progressing   Problem: Elimination: Goal: Will not experience complications related to urinary retention Outcome: Progressing

## 2018-01-31 NOTE — Progress Notes (Signed)
Patient became agitated when his son had left to go to [place]. He was sitting in his recliner chair visting with his granddaughter when he began trying to get up and pulling at his EKG leads and pulse-ox cable. Patient was easily redirected but obviously very anxious and upset about where his son was. He kept saying he needed to go get Ronalee Belts where he had left him. I reminded him that he was in the hospital  And had a heart attack, but he just looked at me with a blank stare. I notified Dr.Harding who consulted pharmacy and decided to order Risperdal BID. Will continue to monitor effectiveness of new medication and reorient patient. Safety sitter was also ordered.

## 2018-01-31 NOTE — Plan of Care (Signed)
  Problem: Activity: Goal: Ability to tolerate increased activity will improve Outcome: Progressing   

## 2018-01-31 NOTE — Progress Notes (Signed)
CARDIAC REHAB PHASE I   PRE:  Rate/Rhythm: 57 SB    BP: sitting 128/83    SaO2: 100 RA  MODE:  Ambulation: 270 ft   POST:  Rate/Rhythm: 72 SR    BP: sitting 155/82     SaO2: 99-100 RA   Pt able to walk at slow pace. Sao2 remained 99-100 RA. No c/o however it was difficult to communicate with pt due to dementia. Left materials for family, will ed with them. [place], ACSM 01/31/2018 3:17 PM

## 2018-02-01 DIAGNOSIS — R21 Rash and other nonspecific skin eruption: Secondary | ICD-10-CM

## 2018-02-01 LAB — TROPONIN I: Troponin I: 36.94 ng/mL (ref <0.03)

## 2018-02-01 LAB — POCT I-STAT CREATININE: Creatinine, Ser: 0.8 mg/dL (ref 0.61–1.24)

## 2018-02-01 MED ORDER — METOPROLOL SUCCINATE ER 25 MG PO TB24
25.0000 mg | ORAL_TABLET | Freq: Every day | ORAL | Status: DC
  Administered 2018-02-02: 25 mg via ORAL
  Filled 2018-02-01: qty 1

## 2018-02-01 MED ORDER — FUROSEMIDE 20 MG PO TABS
20.0000 mg | ORAL_TABLET | Freq: Every day | ORAL | Status: DC
  Administered 2018-02-01 – 2018-02-02 (×2): 20 mg via ORAL
  Filled 2018-02-01 (×2): qty 1

## 2018-02-01 MED ORDER — LOSARTAN POTASSIUM 25 MG PO TABS
25.0000 mg | ORAL_TABLET | Freq: Every day | ORAL | Status: DC
  Administered 2018-02-02: 25 mg via ORAL
  Filled 2018-02-01: qty 1

## 2018-02-01 NOTE — Progress Notes (Signed)
Progress Note  Patient Name: [name] Date of Encounter: 02/01/2018  Primary Cardiologist: No primary care provider on file. ~[place]  Subjective   Again awake and alert.  Sitting in the chair with no complaints. Had quite a bit of delirium and sundowning overnight.  Required low-dose Risperdal.  Usually improves when family is around to help redirect.  Inpatient Medications    Scheduled Meds: . aspirin  81 mg Oral Daily  . atorvastatin  80 mg Oral q1800  . donepezil  10 mg Oral QHS  . enoxaparin (LOVENOX) injection  40 mg Subcutaneous Q24H  . furosemide  20 mg Oral Daily  . hydrocortisone cream   Topical TID  . [START ON 02/02/2018] losartan  25 mg Oral Daily  . memantine  10 mg Oral Daily  . [START ON 02/02/2018] metoprolol succinate  25 mg Oral Daily  . metoprolol tartrate  12.5 mg Oral BID  . nystatin   Topical BID  . risperiDONE  0.25 mg Oral BID  . sodium chloride flush  3 mL Intravenous Q12H  . ticagrelor  90 mg Oral BID   Continuous Infusions: . sodium chloride     PRN Meds: sodium chloride, acetaminophen, Influenza vac split quadrivalent PF, ondansetron (ZOFRAN) IV, pneumococcal 23 valent vaccine, sodium chloride flush   Vital Signs    Vitals:   02/01/18 1355 02/01/18 1515 02/01/18 1539 02/01/18 1600  BP: 109/60 96/72  (!) 79/69  Pulse:      Resp: (!) 22 15  13   Temp:   98.4 F (36.9 C)   TempSrc:   Oral   SpO2: 98% 97%  99%  Weight:      Height:        Intake/Output Summary (Last 24 hours) at 02/01/2018 1644 Last data filed at 02/01/2018 1200 Gross per 24 hour  Intake 706 ml  Output 200 ml  Net 506 ml   Last 3 Weights 01/30/2018 01/30/2018  Weight (lbs) 211 lb 10.3 oz 211 lb 10.3 oz  Weight (kg) 96 kg 96 kg      Telemetry    Mostly sinus rhythm with some mild bradycardia with some PVCs but no further VT.- Personally Reviewed  ECG    No new EKG.  Physical Exam   Physical Exam  Constitutional: He appears well-developed and  well-nourished. No distress.  Relatively healthy-appearing.  HENT:  Head: Normocephalic and atraumatic.  Mouth/Throat: Oropharynx is clear and moist.  Neck: No hepatojugular reflux and no JVD present. Carotid bruit is not present.  Cardiovascular: Regular rhythm, S1 normal, S2 normal and normal pulses.  Occasional extrasystoles are present. Bradycardia present. PMI is not displaced. Exam reveals distant heart sounds. Exam reveals no gallop and no friction rub.  No murmur heard. Pulmonary/Chest: Effort normal and breath sounds normal. No respiratory distress. He has no wheezes.  Mildly diminished basilar breath sounds but no rales or rhonchi.  Musculoskeletal: Normal range of motion.        General: No edema.  Neurological: He is alert.  Oriented to person and place  Skin:  Right radial bruising, no hematoma  Psychiatric: He has a normal mood and affect. His behavior is normal.  Nursing note and vitals reviewed.   Labs    Chemistry Recent Labs  Lab 01/30/18 2133 01/30/18 2134 01/30/18 2136 01/31/18 0733  NA  --  137 137 140  K  --  3.9 3.9 3.8  CL  --   --  106 107  CO2  --   --  22 24  GLUCOSE  --   --  161* 115*  BUN  --   --  17 14  CREATININE 0.80  --  0.89 0.85  CALCIUM  --   --  9.1 8.8*  PROT  --   --  6.5  --   ALBUMIN  --   --  3.5  --   AST  --   --  22  --   ALT  --   --  21  --   ALKPHOS  --   --  76  --   BILITOT  --   --  0.7  --   GFRNONAA  --   --  >60 >60  GFRAA  --   --  >60 >60  ANIONGAP  --   --  9 9     Hematology Recent Labs  Lab 01/30/18 2134 01/30/18 2136 01/31/18 0733  WBC  --  9.6 8.2  RBC  --  4.93 4.97  HGB 14.3 14.5 14.4  HCT 42.0 43.1 43.9  MCV  --  87.4 88.3  MCH  --  29.4 29.0  MCHC  --  33.6 32.8  RDW  --  13.0 13.1  PLT  --  185 187    Cardiac Enzymes Recent Labs  Lab 01/30/18 2136 02/01/18 1001  TROPONINI 0.08* 36.94*   No results for input(s): TROPIPOC in the last 168 hours.   BNPNo results for input(s): BNP,  PROBNP in the last 168 hours.   DDimer No results for input(s): DDIMER in the last 168 hours.   Radiology    No results found.  Cardiac Studies    Cath-PCI 01/30/2017: 50%-100% dRCA - PTCA only - 25% residual (no stent b/c pt to aggitated). dLA-ost LAD ~80% (irregular), pLAD 75% & p-mLAD 80%, D1 60%. dCx 50%. Normal LVEDP.   Echo Pending: EF 30 to 35% with high filling pressures.  Moderate LV thickness.  Severe hypokinesis of the entire septal and inferior wall.  Patient Profile     78 y.o. male with underlying dementia & HTN & former smoker who presented via [facility] with EKG c/w Inferior STEMI. --> CATH with PTCA only on dRCA (no stent due to patient aggitation).   Has severe LCA disease - per Dr. Martinique - Med Rx.  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  Remains on aspirin plus Brilinta.  Started on low-dose beta-blocker in addition to his home dose ARB, but with mild hypotension, will reduce home dose ARB to 25 mg.  High-dose statin.  Cardiac rehab as tolerated  Continue medical management of left coronary disease.  CABG candidate.    NSVT -mostly resolved following beta-blocker initiation.  Echocardiogram recent view confirms ischemic cardiomyopathy with a EF of probably 30-35%.    Is already on Cozaar, follow blood pressure now that we have added low-dose beta-blocker. -->  Reducing Cozaar due to mild  hypotension.  Mild pitting edema noted today.  Will start low-dose oral diuretic  Had a talk with the family about goals of care, I do not think that he would be an ICD candidate in the future, therefore we would probably not consider LifeVest on discharge.    Essential hypertension -hypotensive after rounds today, will hold further titration of medications.  We will reduce Cozaar dose to 25 mg.    Hyperlipidemia with target LDL less than 70 -started on high intensity statin    Rash of groin -nystatin cream  Dementia: Continue Aricept and Namenda.  Smoking cessation counseling   Once blood pressure stabilizes, okay to transfer to ICU.  Hopefully will discharge home soon.  For questions or updates, please contact [place] Please consult [email or web address] for contact info under        Signed, Glenetta Hew, MD  02/01/2018, 4:44 PM

## 2018-02-01 NOTE — Progress Notes (Signed)
Pt very confused, attempting to get OOB, and trying to kick sitter. Reorientation attempted several times with pt getting more and more frustrated.  Son, [name], called and spoke with pt on phone.  Per pt, son is coming to the hospital to sit with him.  Pt agreed to get back in bed while awaiting son's arrival.  Sitter at bedside and bed alarm on.  Will continue to monitor pt.

## 2018-02-01 NOTE — Care Management (Signed)
#  9.   S/W ANGELA @ [facility] # [phone]  TICAGRELOR : NONE FORMULARY  BRILINTA  90  MG BID COVER- YES CO-PAY- $ 352.00 TIER- 3 DRUG PRIOR APPROVAL- NO  DEDUCTIBLE : NOT MET  PREFERRED PHARMACY : YES [facility] [place]

## 2018-02-01 NOTE — Progress Notes (Signed)
CARDIAC REHAB PHASE I   PRE:  Rate/Rhythm: 70 SR  BP:  Supine:   Sitting: 95/72  Standing:    SaO2: 98%RA  MODE:  Ambulation: 400 ft   POST:  Rate/Rhythm: 87 SR  BP:  Supine:   Sitting: 109/60  Standing:    SaO2: 100%RA 1330-1410 Pt walked 400 ft on RA with gait belt use. Pt steady but tendency to look around and stop to talk. Sister walked with [place]. Left MI booklet, heart healthy diet and reviewed NTG use and importance of brilinta with his sister. Will try to follow up tomorrow with his son.   Graylon Good, RN BSN  02/01/2018 2:05 PM

## 2018-02-01 NOTE — Care Management Note (Addendum)
Case Management Note  Patient Details  Name: [name] MRN: [id number] Date of Birth: Jul 09, 1940  Subjective/Objective:  78 yo male presented with STEMI from OSH; s/p cath 1/28             Action/Plan: CM consult acknowledged for med assist and home care needs. CM requesting PT/OT order for home safety eval; CM will enter SW consult for SNF placement if patient will require 24hr assist; patient must have a skilled need; HH is intermittent with no 24hr coverage. CM team will continue to follow.  Expected Discharge Date:  02/02/18               Expected Discharge Plan:  Home/Self Care  In-House Referral:  Clinical Social Work  Discharge planning Services  CM Consult, Medication Assistance(Brilinta)  Post Acute Care Choice:  NA Choice offered to:  NA  DME Arranged:  N/A DME Agency:  NA  HH Arranged:  NA HH Agency:  NA  Status of Service:  In process, will continue to follow  If discussed at Long Length of Stay Meetings, dates discussed:    Additional Comments: 02/01/18 @ 1147-Vincen Bejar RNCM-CM met with the patient and his family to discuss dispositional needs. Patient lives at home alone beside his family who all indicated being extremely involved in his care, with patient having dementia but is more confused d/t him never being hospitalized and being in a strange environment. Family requesting patient transition home with 24hr assistance being provided by his family (son/daughter-in-law able to provide). CM offered [place] support, with family declining d/t patients history of behavioral changes with strangers. Family not happy with patients current PCP with a hospital f/u appointment arranged by CM at: [place] 02/07/18 @ 1300; AVS updated. [place] pharmacy will provide Rx prior to patient transitioning home. CM team will continue to follow.   Midge Minium RN, BSN, NCM-BC, ACM-RN [phone] 02/01/2018, 9:40 AM

## 2018-02-02 ENCOUNTER — Telehealth: Payer: Self-pay | Admitting: Physician Assistant

## 2018-02-02 MED ORDER — ATORVASTATIN CALCIUM 80 MG PO TABS: 80.0000 mg | ORAL_TABLET | Freq: Every day | ORAL | 6 refills | Status: DC

## 2018-02-02 MED ORDER — ASPIRIN 81 MG PO CHEW: 81.0000 mg | CHEWABLE_TABLET | Freq: Every day | ORAL | 3 refills | Status: AC

## 2018-02-02 MED ORDER — LORAZEPAM 2 MG/ML IJ SOLN: 0.5000 mg | Freq: Once | INTRAMUSCULAR | Status: DC

## 2018-02-02 MED ORDER — LOSARTAN POTASSIUM 25 MG PO TABS: 25.0000 mg | ORAL_TABLET | Freq: Every day | ORAL | 3 refills | Status: DC

## 2018-02-02 MED ORDER — METOPROLOL SUCCINATE ER 25 MG PO TB24: 25.0000 mg | ORAL_TABLET | Freq: Every day | ORAL | 6 refills | Status: DC

## 2018-02-02 MED ORDER — TICAGRELOR 90 MG PO TABS: 90.0000 mg | ORAL_TABLET | Freq: Two times a day (BID) | ORAL | 11 refills | Status: DC

## 2018-02-02 MED ORDER — NITROGLYCERIN 0.4 MG SL SUBL: 0.4000 mg | SUBLINGUAL_TABLET | SUBLINGUAL | 12 refills | Status: AC | PRN

## 2018-02-02 MED ORDER — FUROSEMIDE 20 MG PO TABS: 20.0000 mg | ORAL_TABLET | Freq: Every day | ORAL | 3 refills | Status: DC

## 2018-02-02 MED FILL — METOPROLOL SUCCINATE ER 25: 25 | 30 days supply | Qty: 30 | Fill #0

## 2018-02-02 MED FILL — ASPIRIN LOW DOSE 81 MG CHEW: 81 | 90 days supply | Qty: 90 | Fill #0

## 2018-02-02 MED FILL — NITROGLYCERIN 0.4 MG TAB SL: 0.4 | 7 days supply | Qty: 25 | Fill #0

## 2018-02-02 MED FILL — ATORVASTATIN CALCIUM 80 MG: 80 | 30 days supply | Qty: 30 | Fill #0

## 2018-02-02 MED FILL — BRILINTA 90 MG TABLET: 90 | 30 days supply | Qty: 60 | Fill #0

## 2018-02-02 MED FILL — LOSARTAN POTASSIUM 25 MG TA: 25 | 30 days supply | Qty: 30 | Fill #0

## 2018-02-02 MED FILL — FUROSEMIDE 20 MG TAB: 20 | 30 days supply | Qty: 30 | Fill #0

## 2018-02-02 NOTE — Telephone Encounter (Signed)
TOC PATIENT  Please call patient  Patient has a appt with Almyra Deforest on 02/19/18.

## 2018-02-02 NOTE — Progress Notes (Signed)
CM talked to patient about [place] choices, pt / family refused all [place] services at this time; Aneta Mins [phone]

## 2018-02-02 NOTE — Progress Notes (Signed)
[id number] Pt is sleeping. Education completed with pt's son and daughter-in-law who voiced understanding. Gave CHF booklet and discussed when to call MD with signs/symptoms. Discussed importance of daily weights, 2000 mg sodium restriction and 2L FR. Reviewed importance of brilinta, how to use NTG, walking for ex and MI restrictions. Discussed CRP 2 and put in referral to [facility] but pt not appropriate due to dementia. Family stated he will do better walking at home than in structured ex program. Mobility tech will walk with pt later when awake if not discharged. Family stated pt will do much better in his familiar setting with their supervision. Graylon Good RN BSN 02/02/2018 9:29 AM

## 2018-02-02 NOTE — Discharge Instructions (Signed)
No driving for 2 weeks. No lifting over 10 lbs for 4 weeks. No sexual activity for 4 weeks. YKeep procedure site clean & dry. If you notice increased pain, swelling, bleeding or pus, call/return!  You may shower, but no soaking baths/hot tubs/pools for 1 week.

## 2018-02-02 NOTE — Evaluation (Signed)
Physical Therapy Evaluation Patient Details Name: [name] MRN: [id number] DOB: Mar 28, 1940 Today's Date: 02/02/2018   History of Present Illness  Pt is a 78 y/o male admitted secondary to STEMI. Pt with heart cath and s/p PTCA. PMH includes dementia, ischemic cardiomyopathy, and HTN.   Clinical Impression  Pt admitted secondary to problem above with deficits below. Pt with unsteadiness and required min guard to close supervision throughout mobility tasks. Pt somewhat slow to respond to cues, but has baseline dementia. Reviewed need for supervision with mobility at home given current deficits and family agreeable. Feel pt would benefit from HHPT to return to baseline mobility. Will continue to follow acutely to maximize functional mobility independence and safety.     Follow Up Recommendations Home health PT;Supervision/Assistance - 24 hour(24/7 initially)    Equipment Recommendations  None recommended by PT    Recommendations for Other Services       Precautions / Restrictions Precautions Precautions: Fall Restrictions Weight Bearing Restrictions: No      Mobility  Bed Mobility Overal bed mobility: Modified Independent             General bed mobility comments: Increased time, but no physical assist required.   Transfers Overall transfer level: Needs assistance Equipment used: None Transfers: Sit to/from Stand Sit to Stand: Min guard         General transfer comment: Min guard for steadying assist.   Ambulation/Gait Ambulation/Gait assistance: Min guard;Supervision Gait Distance (Feet): 200 Feet Assistive device: None Gait Pattern/deviations: Step-through pattern;Decreased stride length;Drifts right/left Gait velocity: Decreased    General Gait Details: Min guard to close supervision secondary to unsteadiness. No overt LOB noted, however, pt tended to drift in the hallway. Discussed need for supervision at home with pt and pt's family and pt's family  agreeable.   Stairs Stairs: Yes Stairs assistance: Min guard Stair Management: One rail Right;Step to pattern;Forwards Number of Stairs: 2 General stair comments: Practiced stair navigation using rail. Instructed to attempt without rail, however, pt continued to grab for rail. discussed need for assist with steps at home and pt's family verbalized understanding.   Wheelchair Mobility    Modified Rankin (Stroke Patients Only)       Balance Overall balance assessment: Needs assistance Sitting-balance support: Feet supported;No upper extremity supported Sitting balance-Leahy Scale: Good     Standing balance support: No upper extremity supported;During functional activity Standing balance-Leahy Scale: Fair                               Pertinent Vitals/Pain Pain Assessment: No/denies pain    Home Living Family/patient expects to be discharged to:: Private residence Living Arrangements: Alone Available Help at Discharge: Family;Available 24 hours/day Type of Home: House Home Access: Stairs to enter Entrance Stairs-Rails: None Entrance Stairs-Number of Steps: 1 Home Layout: Two level;Able to live on main level with bedroom/bathroom Home Equipment: None      Prior Function Level of Independence: Independent         Comments: Pt's son reports pt spends the day at pt's sons house and returns to his own at night.      Hand Dominance        Extremity/Trunk Assessment   Upper Extremity Assessment Upper Extremity Assessment: Generalized weakness    Lower Extremity Assessment Lower Extremity Assessment: Generalized weakness    Cervical / Trunk Assessment Cervical / Trunk Assessment: Normal  Communication   Communication: No difficulties  Cognition  Arousal/Alertness: Awake/alert Behavior During Therapy: WFL for tasks assessed/performed Overall Cognitive Status: History of cognitive impairments - at baseline                                  General Comments: Dementia at baseline       General Comments General comments (skin integrity, edema, etc.): Pt's son and daughter in law present during session. Reports they can provide supervision for pt at d/c. VSS throughout     Exercises     Assessment/Plan    PT Assessment Patient needs continued PT services  PT Problem List Decreased strength;Decreased balance;Decreased mobility;Decreased cognition;Decreased safety awareness;Decreased knowledge of precautions       PT Treatment Interventions DME instruction;Gait training;Stair training;Functional mobility training;Therapeutic activities;Therapeutic exercise;Balance training;Patient/family education    PT Goals (Current goals can be found in the Care Plan section)  Acute Rehab PT Goals Patient Stated Goal: to go home today PT Goal Formulation: With patient Time For Goal Achievement: 02/16/18 Potential to Achieve Goals: Good    Frequency Min 3X/week   Barriers to discharge        Co-evaluation               AM-PAC PT "6 Clicks" Mobility  Outcome Measure Help needed turning from your back to your side while in a flat bed without using bedrails?: None Help needed moving from lying on your back to sitting on the side of a flat bed without using bedrails?: None Help needed moving to and from a bed to a chair (including a wheelchair)?: A Little Help needed standing up from a chair using your arms (e.g., wheelchair or bedside chair)?: A Little Help needed to walk in hospital room?: A Little Help needed climbing 3-5 steps with a railing? : A Little 6 Click Score: 20    End of Session Equipment Utilized During Treatment: Gait belt Activity Tolerance: Patient tolerated treatment well Patient left: in bed;with call bell/phone within reach;with family/visitor present;with bed alarm set Nurse Communication: Mobility status PT Visit Diagnosis: Unsteadiness on feet (R26.81);Muscle weakness (generalized)  (M62.81)    Time: 4268-3419 PT Time Calculation (min) (ACUTE ONLY): 18 min   Charges:   PT Evaluation $PT Eval Low Complexity: [place], PT, DPT  Acute Rehabilitation Services  Pager: [phone] Office: [phone]   Rudean Hitt 02/02/2018, 11:44 AM

## 2018-02-02 NOTE — Discharge Summary (Signed)
Discharge Summary    Patient ID: [name] MRN: [id number]; DOB: 10/19/1940  Admit date: 01/30/2018 Discharge date: 02/02/2018  Primary Care Provider: No primary care provider on file.  Primary Cardiologist: Dr. Martinique  Discharge Diagnoses    Principal Problem:   Acute ST elevation myocardial infarction (STEMI) of inferior wall (HCC) Active Problems:   Multiple vessel coronary artery disease - PTCA of 100% RCA, Severe dLM-LAD disease (med Rx)   Essential hypertension   Hyperlipidemia with target LDL less than 70   Rash of groin   NSVT (nonsustained ventricular tachycardia) (HCC)   Ischemic cardiomyopathy   Allergies No Known Allergies  Diagnostic Studies/Procedures    TRANSTHORACIC ECHOCARDIOGRAM 01/31/18 IMPRESSIONS   1. The left ventricle appears to be normal in size, has moderate wall thickness 30-35% ejection fraction Spectral Doppler shows impaired relaxation pattern of diastolic filling.  2. There is severe hypokinesis of the entire septal and inferior left ventricular segments.  3. The right ventricle has normal size and mildly reduced systolic function.  4. Right ventricular systolic pressure is could not be assessed.  5. Normal left atrial size.  6. Normal right atrial size.  7. Mitral valve regurgitation is trivial by color flow Doppler.  8. No atrial level shunt detected by color flow Doppler.    Coronary/Graft Acute MI Revascularization  01/30/18  LEFT HEART CATH AND CORONARY ANGIOGRAPHY  Conclusion     Dist RCA-1 lesion is 50% stenosed.  Dist RCA-2 lesion is 100% stenosed.  Dist LM to Ost LAD lesion is 80% stenosed.  Prox LAD lesion is 75% stenosed.  Prox LAD to Mid LAD lesion is 80% stenosed.  1st Diag lesion is 60% stenosed.  Dist Cx lesion is 50% stenosed.  Balloon angioplasty was performed using a BALLOON SAPPHIRE 2.0X15.  Post intervention, there is a 0% residual stenosis.  Post intervention, there is a 25% residual  stenosis.  Balloon angioplasty was performed using a BALLOON SAPPHIRE 2.5X15.  LV end diastolic pressure is normal.   1. 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 Normal LVEDP 3. Successful PTCA only of the distal RCA into the PDA   Plan: DAPT for one year. Will continue IV Aggrastat for 4 hours. Given dementia I do not feel that patient is a candidate for CABG. Will need to treat disease in the LCA medically.     History of Present Illness     78 y.o. male with underlying dementia & HTN & former smoker who presented via [facility] with EKG c/w Inferior STEMI.  Presented to OSH with intermittent sharp, central substernal chest pain occurring throughout the day.  He alerted his son who brought him to the [facility] ER where he was noted to have an inferior STEMI.  On arrival to [facility], the patient states that he felt well and denied CP, SOB, but does endorse some moderate indigestion type sensation in his central chest.  He denied any history of CAD, DM, HL, bleeding disorder or stroke. History is limited by patient's dementia and some information taken from OSH chart.  He took ASA prior to arrival to the ED and at the OSH he was given 5k units of heparin.  He was given 180mg   ticagrelor in route to the cath lab where he was taken for emergent revascularization.  Hospital Course     Consultants: None  1. Acute ST elevation myocardial infarction (STEMI) of inferior wall (HCC)  - Emergent cath showed multiple vessel coronary artery disease - s/p PTCA of 100% dRCA (no stent due to patient aggitation). Severe dLM-LAD disease (med Rx). No further anginal symptoms.  - Continue aspirin plus Brilinta. - Continue BB, ARB and statin.  - Cardiac rehab as  tolerated  2. Acute systolic CHF/ ischemic cardiomyopathy - Echo showed LVEF of 30-35%. Severe hypokinesis of the entire septal and inferior left ventricular segments. - Started on lasix of pitting edema. Weight stable at 211lb.  - Reduced Cozaar due to mild hypotension. - Continue current dose of BB and ARB - He is not an ICD candidate in the future, therefore we would probably not consider LifeVest on discharge.  3. HLD - 01/30/2018: Cholesterol 148; HDL 46; LDL Cholesterol 96; Triglycerides 32; VLDL 6  - Continue high dose statin.  -Consider OP f/u labs 6-8 weeks given statin initiation this admission.  4. Dementia - Continue home Aricept and Namenda. Started on Respiridol due to agitation initiation but DC due to drowsiness.   5. Deconditioning - Will be evaluated by PT/OT prior to discharge later today  Discharge Vitals Blood pressure 118/80, pulse 70, temperature 98.4 F (36.9 C), temperature source Axillary, resp. rate 18, height 5\' 10"  (1.778 m), weight 96 kg, SpO2 99 %.  Filed Weights   01/30/18 2125 01/30/18 2215  Weight: 96 kg 96 kg   Physical Exam  Constitutional: He is oriented to person, place, and time and well-developed, well-nourished, and in no distress.  Eyes: Pupils are equal, round, and reactive to light. EOM are normal.  Neck: Normal range of motion. Neck supple.  Cardiovascular: Normal rate and regular rhythm.  Pulmonary/Chest: Effort normal and breath sounds normal.  Abdominal: Soft. Bowel sounds are normal.  Neurological: He is alert and oriented to person, place, and time.  Skin: Skin is warm and dry.  Psychiatric: Affect normal.   Labs & Radiologic Studies    CBC Recent Labs    01/30/18 2136 01/31/18 0733  WBC 9.6 8.2  HGB 14.5 14.4  HCT 43.1 43.9  MCV 87.4 88.3  PLT 185 672   Basic Metabolic Panel Recent Labs    01/30/18 2136 01/31/18 0733 01/31/18 0753  NA 137 140  --   K 3.9 3.8  --   CL 106 107  --   CO2 22 24  --   GLUCOSE  161* 115*  --   BUN 17 14  --   CREATININE 0.89 0.85  --   CALCIUM 9.1 8.8*  --   MG  --   --  2.0   Liver Function Tests Recent Labs    01/30/18 2136  AST 22  ALT 21  ALKPHOS 76  BILITOT 0.7  PROT 6.5  ALBUMIN 3.5   No results for input(s): LIPASE, AMYLASE in the last 72 hours. Cardiac Enzymes Recent Labs    01/30/18 2136 02/01/18 1001  TROPONINI 0.08* 36.94*   BNP Invalid input(s): POCBNP D-Dimer No results for input(s): DDIMER in the last 72 hours. Hemoglobin A1C Recent Labs    01/30/18 2136  HGBA1C 5.3   Fasting Lipid Panel Recent Labs    01/30/18 2136  CHOL 148  HDL 46  LDLCALC 96  TRIG 32  CHOLHDL 3.2   Thyroid Function Tests No results for  input(s): TSH, T4TOTAL, T3FREE, THYROIDAB in the last 72 hours.  Invalid input(s): FREET3 _____________  No results found. Disposition   Pt is being discharged home today in good condition.  Follow-up Plans & Appointments    Follow-up Information    [facility]. Go on 02/07/2018.   Why:  at 1:00 pm for you new primary care appointment. Contact information: [address] [place] [address] [phone]         Discharge Instructions    Amb Referral to Cardiac Rehabilitation   Complete by:  As directed    Pt has dementia and family stated pt would do better walking at home than in exercise setting   Diagnosis:   PTCA STEMI     Diet - low sodium heart healthy   Complete by:  As directed    Increase activity slowly   Complete by:  As directed       Discharge Medications   Allergies as of 02/02/2018   No Known Allergies     Medication List    TAKE these medications   alum hydroxide-mag trisilicate 17-51 MG Chew chewable tablet Commonly known as:  GAVISCON Chew 2 tablets by mouth 3 (three) times daily as needed for indigestion, heartburn or flatulence.   aspirin 81 MG chewable tablet Chew 1 tablet (81 mg total) by mouth daily. Start taking  on:  February 03, 2018   atorvastatin 80 MG tablet Commonly known as:  LIPITOR Take 1 tablet (80 mg total) by mouth daily at 6 PM.   donepezil 10 MG tablet Commonly known as:  ARICEPT Take 10 mg by mouth at bedtime.   furosemide 20 MG tablet Commonly known as:  LASIX Take 1 tablet (20 mg total) by mouth daily. Start taking on:  February 03, 2018   losartan 25 MG tablet Commonly known as:  COZAAR Take 1 tablet (25 mg total) by mouth daily. What changed:    medication strength  how much to take   memantine 10 MG tablet Commonly known as:  NAMENDA Take 10 mg by mouth daily.   metoprolol succinate 25 MG 24 hr tablet Commonly known as:  TOPROL-XL Take 1 tablet (25 mg total) by mouth daily. Start taking on:  February 03, 2018   nitroGLYCERIN 0.4 MG SL tablet Commonly known as:  NITROSTAT Place 1 tablet (0.4 mg total) under the tongue every 5 (five) minutes as needed.   ticagrelor 90 MG Tabs tablet Commonly known as:  BRILINTA Take 1 tablet (90 mg total) by mouth 2 (two) times daily.        Acute coronary syndrome (MI, NSTEMI, STEMI, etc) this admission?: Yes.     AHA/ACC Clinical Performance & Quality Measures: 1. Aspirin prescribed? - Yes 2. ADP Receptor Inhibitor (Plavix/Clopidogrel, Brilinta/Ticagrelor or Effient/Prasugrel) prescribed (includes medically managed patients)? - Yes 3. Beta Blocker prescribed? - Yes 4. High Intensity Statin (Lipitor 40-80mg  or Crestor 20-40mg ) prescribed? - Yes 5. EF assessed during THIS hospitalization? - Yes 6. For EF <40%, was ACEI/ARB prescribed? -Yes 7. For EF <40%, Aldosterone Antagonist (Spironolactone or Eplerenone) prescribed? - No - Reason:  Will plan to add as outpatient  8. Cardiac Rehab Phase II ordered (Included Medically managed Patients)? - Yes     Outstanding Labs/Studies   Consider OP f/u labs 6-8 weeks given statin initiation this admission.  Duration of Discharge Encounter   Greater than 30 minutes including  physician time.  Jarrett Soho, PA 02/02/2018, 10:48 AM

## 2018-02-02 NOTE — Telephone Encounter (Signed)
Patient discharged today 02/02/2018 First TOC outreach should be 02/05/2018

## 2018-02-05 NOTE — Telephone Encounter (Signed)
Spoke with POA son [name].   Patient contacted regarding discharge from [facility] on 02/02/2018.  Patient understands to follow up with provider Almyra Deforest, PA-C on 02/19/2018 at 10:00 am at [facility] office. Patient understands discharge instructions? Yes Patient understands medications and regiment? Yes Patient understands to bring all medications to this visit? Yes

## 2018-02-06 NOTE — Progress Notes (Signed)
Transitions of Care Follow Up Call Note  [name] is an 78 y.o. male who presented to [facility] on 01/30/2018.  The patient had the following prescriptions filled at [facility]: Aspirin, Atorvastatin, Furosemide, Losartan, Metoprolol, Nitrostat, Brilinta   Patient was called by pharmacist and HIPAA identifiers were verified. The following questions were asked about the prescriptions filled at [place]:  Has the patient been experiencing any side effects to the medications prescribed? Yes, experienced some SOB as expected with Brilinta, resolved with caffeine, has not experienced any more episodes.  Understanding of regimen: good Understanding of indications: good Potential of compliance: good  Pharmacist comments: Spoke to son, [name], who is patient's healthcare POA. Reports his father is doing well. No missed doses. No concerns at this time.    [x]  Patient's prescriptions filled at the [facility] were transferred to the following pharmacy: [facility] [place]   []  Patient unable to be reached after calling three times and prescriptions filled at the [facility] Trans[facility] were transferred to preferred pharmacy found within their chart.   [name] 02/06/2018, 6:19 PM Transitions of Care Pharmacy Hours: Monday - Friday 8:30am to 5:00 PM  Phone - [phone]

## 2018-02-07 ENCOUNTER — Ambulatory Visit: Payer: Self-pay | Admitting: Family Medicine

## 2018-02-12 ENCOUNTER — Ambulatory Visit (INDEPENDENT_AMBULATORY_CARE_PROVIDER_SITE_OTHER): Payer: Medicare Other | Admitting: Family Medicine

## 2018-02-12 ENCOUNTER — Encounter: Payer: Self-pay | Admitting: Family Medicine

## 2018-02-12 VITALS — BP 121/79 | HR 57 | Temp 98.1°F | Ht 70.0 in | Wt 197.4 lb

## 2018-02-12 DIAGNOSIS — I2510 Atherosclerotic heart disease of native coronary artery without angina pectoris: Secondary | ICD-10-CM | POA: Diagnosis not present

## 2018-02-12 DIAGNOSIS — I209 Angina pectoris, unspecified: Secondary | ICD-10-CM

## 2018-02-12 MED ORDER — DONEPEZIL HCL 10 MG PO TABS: 10.0000 mg | ORAL_TABLET | Freq: Every day | ORAL | 1 refills | Status: DC

## 2018-02-12 MED ORDER — MEMANTINE HCL 10 MG PO TABS: 10.0000 mg | ORAL_TABLET | Freq: Every day | ORAL | 1 refills | Status: DC

## 2018-02-12 NOTE — Progress Notes (Signed)
Established Patient Office Visit  Subjective:  Patient ID: [name], male    DOB: 05-23-1940  Age: 78 y.o. MRN: [id number]  CC:  Chief Complaint  Patient presents with  . New Patient (Initial Visit)    pt here today to establish care and was recently in hospital for STEMI    HPI [name] presents for establish is held for future care.  He has a history of myocardial infarction.  He was hospitalized 2 weeks ago.  Catheterization performed showed multiple blockages.  He is not a candidate for a bypass and stenting was not indicated at the time.  History is given by his son.  Son tells me that that is under treatment for dementia.  Father does not remember anything after about 5 minutes.  Short-term memory is completely gone.  This is in spite of maximal treatment with Namenda and Aricept.  He tolerates the medicines well and it seems to help at least communicate basically between him and the family members.  However, he does not remember much about many of the people in his life.  Patient does not remember having had a heart attack on many occasions according to the son as well.  However currently he denies chest pain.  Son verifies that he has not complained of chest pain and is forgotten it either.  He does take several medications that were recently prescribed including the atorvastatin, aspirin, Brilinta, metoprolol and Lasix.  Son is worried about side effects and says that dad has lost about 5 pounds since hospital discharge in 2 weeks.  However he was swollen at discharge now his swelling has resolved.  He was told by cardiology that if he gained 5 pounds or more than he should increase the Lasix.  If he lost weight he should decrease it.  Past Medical History:  Diagnosis Date  . Dementia ([place])   . Hypertension   . Myocardial infarction [facility])     Past Surgical History:  Procedure Laterality Date  . CORONARY/GRAFT ACUTE MI REVASCULARIZATION N/A 01/30/2018   Procedure:  Coronary/Graft Acute MI Revascularization;  Surgeon: Martinique, Peter M, MD;  Location: [place] [facility];  Service: Cardiovascular;  Laterality: N/A;  . LEFT HEART CATH AND CORONARY ANGIOGRAPHY N/A 01/30/2018   Procedure: LEFT HEART CATH AND CORONARY ANGIOGRAPHY;  Surgeon: Martinique, Peter M, MD;  Location: [place] [facility];  Service: Cardiovascular;  Laterality: N/A;    Family History  Problem Relation Age of Onset  . Arthritis Mother   . Hypertension Mother   . Obesity Mother   . Stroke Mother   . Diabetes Mother   . Stroke Father   . Pulmonary embolism Sister   . Arthritis Son   . Asthma Son   . Diabetes Son   . Obesity Son   . Heart disease Paternal Grandfather   . Depression Sister     Social History   Socioeconomic History  . Marital status: Divorced    Spouse name: Not on file  . Number of children: Not on file  . Years of education: Not on file  . Highest education level: Not on file  Occupational History  . Not on file  Social Needs  . Financial resource strain: Not on file  . Food insecurity:    Worry: Not on file    Inability: Not on file  . Transportation needs:    Medical: Not on file    Non-medical: Not on file  Tobacco Use  .  Smoking status: Former Smoker    Packs/day: 1.00    Years: 25.00    Pack years: 25.00    Types: Cigarettes    Last attempt to quit: 02/13/1988    Years since quitting: 30.0  . Smokeless tobacco: Never Used  Substance and Sexual Activity  . Alcohol use: Not Currently  . Drug use: Never  . Sexual activity: Not Currently  Lifestyle  . Physical activity:    Days per week: Not on file    Minutes per session: Not on file  . Stress: Not on file  Relationships  . Social connections:    Talks on phone: Not on file    Gets together: Not on file    Attends religious service: Not on file    Active member of club or organization: Not on file    Attends meetings of clubs or organizations: Not on file    Relationship status: Not on  file  . Intimate partner violence:    Fear of current or ex partner: Not on file    Emotionally abused: Not on file    Physically abused: Not on file    Forced sexual activity: Not on file  Other Topics Concern  . Not on file  Social History Narrative  . Not on file    Outpatient Medications Prior to Visit  Medication Sig Dispense Refill  . aspirin 81 MG chewable tablet Chew 1 tablet (81 mg total) by mouth daily. 90 tablet 3  . atorvastatin (LIPITOR) 80 MG tablet Take 1 tablet (80 mg total) by mouth daily at 6 PM. 30 tablet 6  . cetirizine (ZYRTEC) 10 MG tablet Take 10 mg by mouth daily.    . Esomeprazole Magnesium (NEXIUM PO) Take 20 mg by mouth.    . furosemide (LASIX) 20 MG tablet Take 1 tablet (20 mg total) by mouth daily. 30 tablet 3  . losartan (COZAAR) 25 MG tablet Take 1 tablet (25 mg total) by mouth daily. 30 tablet 3  . metoprolol succinate (TOPROL-XL) 25 MG 24 hr tablet Take 1 tablet (25 mg total) by mouth daily. 25 tablet 6  . nitroGLYCERIN (NITROSTAT) 0.4 MG SL tablet Place 1 tablet (0.4 mg total) under the tongue every 5 (five) minutes as needed. 25 tablet 12  . ticagrelor (BRILINTA) 90 MG TABS tablet Take 1 tablet (90 mg total) by mouth 2 (two) times daily. 60 tablet 11  . donepezil (ARICEPT) 10 MG tablet Take 10 mg by mouth at bedtime.    . memantine (NAMENDA) 10 MG tablet Take 10 mg by mouth daily.    Marland Kitchen alum hydroxide-mag trisilicate (GAVISCON) 60-45 MG CHEW chewable tablet Chew 2 tablets by mouth 3 (three) times daily as needed for indigestion, heartburn or flatulence.     No facility-administered medications prior to visit.     No Known Allergies  ROS Review of Systems  Constitutional: Negative.   HENT: Negative.   Eyes: Negative for visual disturbance.  Respiratory: Negative for cough and shortness of breath.   Cardiovascular: Negative for chest pain and leg swelling.  Gastrointestinal: Negative for abdominal pain, diarrhea, nausea and vomiting.    Genitourinary: Negative for difficulty urinating.  Musculoskeletal: Negative for arthralgias and myalgias.  Skin: Negative for rash.  Neurological: Negative for headaches.  Psychiatric/Behavioral: Negative for sleep disturbance.      Objective:    Physical Exam  Constitutional: He is oriented to person, place, and time. He appears well-developed and well-nourished. No distress.  HENT:  Head:  Normocephalic and atraumatic.  Right Ear: External ear normal.  Left Ear: External ear normal.  Nose: Nose normal.  Mouth/Throat: Oropharynx is clear and moist.  Eyes: Pupils are equal, round, and reactive to light. Conjunctivae and EOM are normal.  Neck: Normal range of motion. Neck supple. No thyromegaly present.  Cardiovascular: Normal rate, regular rhythm and normal heart sounds.  No murmur heard. Pulmonary/Chest: Effort normal and breath sounds normal. No respiratory distress. He has no wheezes. He has no rales.  Abdominal: Soft. Bowel sounds are normal. He exhibits no distension. There is no abdominal tenderness.  Lymphadenopathy:    He has no cervical adenopathy.  Neurological: He is alert and oriented to person, place, and time. He has normal reflexes.  Skin: Skin is warm and dry.  Psychiatric: His speech is normal. Thought content normal. His affect is blunt. He is slowed. Cognition and memory are impaired. He exhibits abnormal recent memory and abnormal remote memory.    BP 121/79   Pulse (!) 57   Temp 98.1 F (36.7 C) (Oral)   Ht 5' 10"  (1.778 m)   Wt 197 lb 6 oz (89.5 kg)   BMI 28.32 kg/m  Wt Readings from Last 3 Encounters:  02/12/18 197 lb 6 oz (89.5 kg)  01/30/18 211 lb 10.3 oz (96 kg)     Health Maintenance Due  Topic Date Due  . TETANUS/TDAP  07/14/1961  . COLONOSCOPY  07/14/1992  . PNA vac Low Risk Adult (1 of 2 - PCV13) 07/15/2007  . INFLUENZA VACCINE  08/03/2017    There are no preventive care reminders to display for this patient.  No results found  for: TSH Lab Results  Component Value Date   WBC 8.2 01/31/2018   HGB 14.4 01/31/2018   HCT 43.9 01/31/2018   MCV 88.3 01/31/2018   PLT 187 01/31/2018   Lab Results  Component Value Date   NA 140 01/31/2018   K 3.8 01/31/2018   CO2 24 01/31/2018   GLUCOSE 115 (H) 01/31/2018   BUN 14 01/31/2018   CREATININE 0.85 01/31/2018   BILITOT 0.7 01/30/2018   ALKPHOS 76 01/30/2018   AST 22 01/30/2018   ALT 21 01/30/2018   PROT 6.5 01/30/2018   ALBUMIN 3.5 01/30/2018   CALCIUM 8.8 (L) 01/31/2018   ANIONGAP 9 01/31/2018   Lab Results  Component Value Date   CHOL 148 01/30/2018   Lab Results  Component Value Date   HDL 46 01/30/2018   Lab Results  Component Value Date   LDLCALC 96 01/30/2018   Lab Results  Component Value Date   TRIG 32 01/30/2018   Lab Results  Component Value Date   CHOLHDL 3.2 01/30/2018   Lab Results  Component Value Date   HGBA1C 5.3 01/30/2018      Assessment & Plan:   Problem List Items Addressed This Visit      Active Problems   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 donepezil (ARICEPT) 10 MG tablet    Sig: Take 1 tablet (10 mg total) by mouth at bedtime.    Dispense:  90 tablet    Refill:  1  . memantine (NAMENDA) 10 MG tablet    Sig: Take 1 tablet (10 mg total) by mouth daily.    Dispense:  90  tablet    Refill:  1   Since patient's exam today indicates euvolemia.  I would like for him the son to use today's weight is the baseline for gaining and losing weight with regard to use of Lasix. Follow-up: No follow-ups on file.    Claretta Fraise, MD

## 2018-02-13 LAB — CBC WITH DIFFERENTIAL/PLATELET
Basophils Absolute: 0 10*3/uL (ref 0.0–0.2)
Basos: 0 %
EOS (ABSOLUTE): 0.1 10*3/uL (ref 0.0–0.4)
EOS: 2 %
Hematocrit: 41.5 % (ref 37.5–51.0)
Hemoglobin: 14.7 g/dL (ref 13.0–17.7)
Immature Grans (Abs): 0 10*3/uL (ref 0.0–0.1)
Immature Granulocytes: 1 %
Lymphocytes Absolute: 1.4 10*3/uL (ref 0.7–3.1)
Lymphs: 20 %
MCH: 30.2 pg (ref 26.6–33.0)
MCHC: 35.4 g/dL (ref 31.5–35.7)
MCV: 85 fL (ref 79–97)
Monocytes Absolute: 0.7 10*3/uL (ref 0.1–0.9)
Monocytes: 11 %
Neutrophils Absolute: 4.7 10*3/uL (ref 1.4–7.0)
Neutrophils: 66 %
Platelets: 231 10*3/uL (ref 150–450)
RBC: 4.86 x10E6/uL (ref 4.14–5.80)
RDW: 12.9 % (ref 11.6–15.4)
WBC: 7 10*3/uL (ref 3.4–10.8)

## 2018-02-13 LAB — CMP14+EGFR
ALK PHOS: 94 IU/L (ref 39–117)
ALT: 20 IU/L (ref 0–44)
AST: 24 IU/L (ref 0–40)
Albumin/Globulin Ratio: 1.5 (ref 1.2–2.2)
Albumin: 4 g/dL (ref 3.7–4.7)
BUN/Creatinine Ratio: 14 (ref 10–24)
BUN: 14 mg/dL (ref 8–27)
Bilirubin Total: 0.9 mg/dL (ref 0.0–1.2)
CO2: 28 mmol/L (ref 20–29)
Calcium: 9.1 mg/dL (ref 8.6–10.2)
Chloride: 97 mmol/L (ref 96–106)
Creatinine, Ser: 1.03 mg/dL (ref 0.76–1.27)
GFR calc Af Amer: 82 mL/min/{1.73_m2} (ref >59)
GFR calc non Af Amer: 71 mL/min/{1.73_m2} (ref >59)
Globulin, Total: 2.6 g/dL (ref 1.5–4.5)
Glucose: 94 mg/dL (ref 65–99)
Potassium: 3.7 mmol/L (ref 3.5–5.2)
Sodium: 140 mmol/L (ref 134–144)
Total Protein: 6.6 g/dL (ref 6.0–8.5)

## 2018-02-13 NOTE — Progress Notes (Signed)
Hello [name],  Your lab result is normal.Some minor variations that are not significant are commonly marked abnormal, but do not represent any medical problem for you.  Best regards, Claretta Fraise, M.D.

## 2018-02-19 ENCOUNTER — Ambulatory Visit (INDEPENDENT_AMBULATORY_CARE_PROVIDER_SITE_OTHER): Payer: Medicare Other | Admitting: Physician Assistant

## 2018-02-19 ENCOUNTER — Encounter: Payer: Self-pay | Admitting: Physician Assistant

## 2018-02-19 ENCOUNTER — Other Ambulatory Visit: Payer: Self-pay

## 2018-02-19 VITALS — BP 152/80 | HR 56 | Ht 71.0 in | Wt 199.4 lb

## 2018-02-19 DIAGNOSIS — F0390 Unspecified dementia without behavioral disturbance: Secondary | ICD-10-CM | POA: Diagnosis not present

## 2018-02-19 DIAGNOSIS — I2119 ST elevation (STEMI) myocardial infarction involving other coronary artery of inferior wall: Secondary | ICD-10-CM

## 2018-02-19 DIAGNOSIS — E785 Hyperlipidemia, unspecified: Secondary | ICD-10-CM

## 2018-02-19 DIAGNOSIS — I255 Ischemic cardiomyopathy: Secondary | ICD-10-CM

## 2018-02-19 NOTE — Patient Instructions (Signed)
Medication Instructions:   Your physician recommends that you continue on your current medications as directed. Please refer to the Current Medication list given to you today.  If you need a refill on your cardiac medications before your next appointment, please call your pharmacy.   Lab work: You will need to have the following lab work done in 2 months:   LIVER TEST LIPID   If you have labs (blood work) drawn today and your tests are completely normal, you will receive your results only by: Marland Kitchen MyChart Message (if you have MyChart) OR . A paper copy in the mail If you have any lab test that is abnormal or we need to change your treatment, we will call you to review the results.  Testing/Procedures:  NONE  Follow-Up: At [facility], you and your health needs are our priority.  As part of our continuing mission to provide you with exceptional heart care, we have created designated Provider Care Teams.  These Care Teams include your primary Cardiologist (physician) and Advanced Practice Providers (APPs -  Physician Assistants and Nurse Practitioners) who all work together to provide you with the care you need, when you need it. You will need a follow up appointment in 2-3 months.  Please call our office 2 months in advance to schedule this appointment.  You may see Peter Martinique, MD or one of the following Advanced Practice Providers on your designated Care Team: [place], [place] . Fabian Sharp, PA-C  Any Other Special Instructions Will Be Listed Below (If Applicable).

## 2018-02-19 NOTE — Progress Notes (Signed)
Cardiology Office Note    Date:  02/21/2018   ID:  [name], DOB Apr 16, 1940, MRN [id number]  PCP:  Claretta Fraise, MD  Cardiologist: Dr. Martinique  Chief Complaint  Patient presents with  . Follow-up    seen for Dr. Martinique.     History of Present Illness:  [name] is a 78 y.o. male with PMH of HTN, dementia, former smoker who recently was admitted to the hospital with a inferior STEMI transferred from [place].  Emergent cardiac catheterization showed multivessel CAD with severe distal left main and LAD disease, 100% distal RCA occlusion treated with PTCA.  Due to significant dementia, he was felt not to be a candidate for bypass surgery.  Postprocedure, he was placed on aspirin and Brilinta along with beta-blocker and statin.  Echocardiogram showed EF of 30 to 35%, severe hypokinesis of the entire septal and inferior left ventricular segment.  He was also started on Lasix due to pitting edema in the hospital.  He is not a ICD candidate in the future, goal of care has been discussed with family, therefore he was not discharged on LifeVest.  Patient presents today for cardiology office visit.  He appears to be euvolemic on physical exam.  Lab work after discharge showed stable renal function and electrolyte.  I will continue him on the current medication.  His blood pressure is a little bit elevated today, however it was normal just 7 days ago when he saw his primary care provider.  He can continue on the current medication.  Since discharge, he has not experienced any exertional chest pain or shortness of breath.  He can follow-up with Dr. Martinique in 2 to 3 months.   Past Medical History:  Diagnosis Date  . Dementia ([place])   . Hypertension   . Myocardial infarction [facility])     Past Surgical History:  Procedure Laterality Date  . CORONARY/GRAFT ACUTE MI REVASCULARIZATION N/A 01/30/2018   Procedure: Coronary/Graft Acute MI Revascularization;  Surgeon: Martinique, Peter M, MD;   Location: [place] [facility];  Service: Cardiovascular;  Laterality: N/A;  . LEFT HEART CATH AND CORONARY ANGIOGRAPHY N/A 01/30/2018   Procedure: LEFT HEART CATH AND CORONARY ANGIOGRAPHY;  Surgeon: Martinique, Peter M, MD;  Location: [place] [facility];  Service: Cardiovascular;  Laterality: N/A;    Current Medications: Outpatient Medications Prior to Visit  Medication Sig Dispense Refill  . aspirin 81 MG chewable tablet Chew 1 tablet (81 mg total) by mouth daily. 90 tablet 3  . atorvastatin (LIPITOR) 80 MG tablet Take 1 tablet (80 mg total) by mouth daily at 6 PM. 30 tablet 6  . cetirizine (ZYRTEC) 10 MG tablet Take 10 mg by mouth daily.    Marland Kitchen donepezil (ARICEPT) 10 MG tablet Take 1 tablet (10 mg total) by mouth at bedtime. 90 tablet 1  . Esomeprazole Magnesium (NEXIUM PO) Take 20 mg by mouth.    . furosemide (LASIX) 20 MG tablet Take 1 tablet (20 mg total) by mouth daily. 30 tablet 3  . losartan (COZAAR) 25 MG tablet Take 1 tablet (25 mg total) by mouth daily. 30 tablet 3  . memantine (NAMENDA) 10 MG tablet Take 1 tablet (10 mg total) by mouth daily. 90 tablet 1  . metoprolol succinate (TOPROL-XL) 25 MG 24 hr tablet Take 1 tablet (25 mg total) by mouth daily. 25 tablet 6  . nitroGLYCERIN (NITROSTAT) 0.4 MG SL tablet Place 1 tablet (0.4 mg total) under the tongue every 5 (five)  minutes as needed. 25 tablet 12  . ticagrelor (BRILINTA) 90 MG TABS tablet Take 1 tablet (90 mg total) by mouth 2 (two) times daily. 60 tablet 11   No facility-administered medications prior to visit.      Allergies:   Patient has no known allergies.   Social History   Socioeconomic History  . Marital status: Divorced    Spouse name: Not on file  . Number of children: Not on file  . Years of education: Not on file  . Highest education level: Not on file  Occupational History  . Not on file  Social Needs  . Financial resource strain: Not on file  . Food insecurity:    Worry: Not on file    Inability: Not on  file  . Transportation needs:    Medical: Not on file    Non-medical: Not on file  Tobacco Use  . Smoking status: Former Smoker    Packs/day: 1.00    Years: 25.00    Pack years: 25.00    Types: Cigarettes    Last attempt to quit: 02/13/1988    Years since quitting: 30.0  . Smokeless tobacco: Never Used  Substance and Sexual Activity  . Alcohol use: Not Currently  . Drug use: Never  . Sexual activity: Not Currently  Lifestyle  . Physical activity:    Days per week: Not on file    Minutes per session: Not on file  . Stress: Not on file  Relationships  . Social connections:    Talks on phone: Not on file    Gets together: Not on file    Attends religious service: Not on file    Active member of club or organization: Not on file    Attends meetings of clubs or organizations: Not on file    Relationship status: Not on file  Other Topics Concern  . Not on file  Social History Narrative  . Not on file     Family History:  The patient's family history includes Arthritis in his mother and son; Asthma in his son; Depression in his sister; Diabetes in his mother and son; Heart disease in his paternal grandfather; Hypertension in his mother; Obesity in his mother and son; Pulmonary embolism in his sister; Stroke in his father and mother.   ROS:   Please see the history of present illness.    ROS All other systems reviewed and are negative.   PHYSICAL EXAM:   VS:  BP (!) 152/80   Pulse (!) 56   Ht 5\' 11"  (1.803 m)   Wt 199 lb 6.4 oz (90.4 kg)   SpO2 98%   BMI 27.81 kg/m    GEN: Well nourished, well developed, in no acute distress  HEENT: normal  Neck: no JVD, carotid bruits, or masses Cardiac: RRR; no murmurs, rubs, or gallops,no edema  Respiratory:  clear to auscultation bilaterally, normal work of breathing GI: soft, nontender, nondistended, + BS MS: no deformity or atrophy  Skin: warm and dry, no rash Neuro:  Alert and Oriented x 3, Strength and sensation are  intact Psych: euthymic mood, full affect  Wt Readings from Last 3 Encounters:  02/19/18 199 lb 6.4 oz (90.4 kg)  02/12/18 197 lb 6 oz (89.5 kg)  01/30/18 211 lb 10.3 oz (96 kg)      Studies/Labs Reviewed:   EKG:  EKG is ordered today.  The ekg ordered today demonstrates sinus bradycardia, inferior infarct  Recent Labs: 01/31/2018: Magnesium 2.0 02/12/2018:  ALT 20; BUN 14; Creatinine, Ser 1.03; Hemoglobin 14.7; Platelets 231; Potassium 3.7; Sodium 140   Lipid Panel    Component Value Date/Time   CHOL 148 01/30/2018 2136   TRIG 32 01/30/2018 2136   HDL 46 01/30/2018 2136   CHOLHDL 3.2 01/30/2018 2136   VLDL 6 01/30/2018 2136   [place] 96 01/30/2018 2136    Additional studies/ records that were reviewed today include:   Cath 01/30/2018  Dist RCA-1 lesion is 50% stenosed.  Dist RCA-2 lesion is 100% stenosed.  Dist LM to Ost LAD lesion is 80% stenosed.  Prox LAD lesion is 75% stenosed.  Prox LAD to Mid LAD lesion is 80% stenosed.  1st Diag lesion is 60% stenosed.  Dist Cx lesion is 50% stenosed.  Balloon angioplasty was performed using a BALLOON SAPPHIRE 2.0X15.  Post intervention, there is a 0% residual stenosis.  Post intervention, there is a 25% residual stenosis.  Balloon angioplasty was performed using a BALLOON SAPPHIRE 2.5X15.  LV end diastolic pressure is normal.   1. 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 Normal LVEDP 3. Successful PTCA only of the distal RCA into the PDA   Plan: DAPT for one year. Will continue IV Aggrastat for 4 hours. Given dementia I do not feel that patient is a candidate for CABG. Will need to treat disease in the LCA medically.   Echo 01/31/2018  1. The left ventricle appears to be normal in size, has  moderate wall thickness 30-35% ejection fraction Spectral Doppler shows impaired relaxation pattern of diastolic filling.  2. There is severe hypokinesis of the entire septal and inferior left ventricular segments.  3. The right ventricle has normal size and mildly reduced systolic function.  4. Right ventricular systolic pressure is could not be assessed.  5. Normal left atrial size.  6. Normal right atrial size.  7. Mitral valve regurgitation is trivial by color flow Doppler.  8. No atrial level shunt detected by color flow Doppler.     ASSESSMENT:    1. Acute ST elevation myocardial infarction (STEMI) involving other coronary artery of inferior wall (HCC)   2. Hyperlipidemia with target LDL less than 70   3. Dementia without behavioral disturbance, unspecified dementia type ([place])   4. Ischemic cardiomyopathy      PLAN:  In order of problems listed above:  1. CAD: Recently had inferior STEMI.  Underwent PTCA of distal RCA.  Continue aspirin, Lipitor and Brilinta.  Patient has significant left main disease not amenable to PCI or CABG.  We will continue medical therapy  2. Hyperlipidemia: On Lipitor 80 mg daily.  Fasting lipid panel and liver function test in 2 months  3. Ischemic cardiomyopathy: On losartan and Toprol-XL.  Consider repeat echocardiogram in 16-month    Medication Adjustments/Labs and Tests Ordered: Current medicines are reviewed at length with the patient today.  Concerns regarding medicines are outlined above.  Medication changes, Labs and Tests ordered today are listed in the Patient Instructions below. Patient Instructions  Medication Instructions:   Your physician recommends that you continue on your current medications as directed. Please refer to the Current Medication list given to you today.  If you need a refill on your cardiac  medications before your next appointment, please call your pharmacy.   Lab work: You will need to have the following lab  work done in 2 months:   LIVER TEST LIPID   If you have labs (blood work) drawn today and your tests are completely normal, you will receive your results only by: Marland Kitchen MyChart Message (if you have MyChart) OR . A paper copy in the mail If you have any lab test that is abnormal or we need to change your treatment, we will call you to review the results.  Testing/Procedures:  NONE  Follow-Up: At [facility], you and your health needs are our priority.  As part of our continuing mission to provide you with exceptional heart care, we have created designated Provider Care Teams.  These Care Teams include your primary Cardiologist (physician) and Advanced Practice Providers (APPs -  Physician Assistants and Nurse Practitioners) who all work together to provide you with the care you need, when you need it. You will need a follow up appointment in 2-3 months.  Please call our office 2 months in advance to schedule this appointment.  You may see Peter Martinique, MD or one of the following Advanced Practice Providers on your designated Care Team: [place], [place] . Fabian Sharp, PA-C  Any Other Special Instructions Will Be Listed Below (If Applicable).       Hilbert Corrigan, [place]  02/21/2018 10:54 PM    [facility] [place], [place], [address] [place]  [address] Phone: [phone]; Fax: [phone]

## 2018-02-21 ENCOUNTER — Encounter: Payer: Self-pay | Admitting: Physician Assistant

## 2018-03-26 ENCOUNTER — Ambulatory Visit: Payer: Medicare Other | Admitting: Family Medicine

## 2018-04-17 ENCOUNTER — Other Ambulatory Visit: Payer: Self-pay | Admitting: Physician Assistant

## 2018-04-30 ENCOUNTER — Ambulatory Visit: Payer: Medicare Other | Admitting: Family Medicine

## 2018-05-09 ENCOUNTER — Telehealth: Payer: Self-pay | Admitting: Family Medicine

## 2018-05-09 NOTE — Telephone Encounter (Signed)
VM full, pt needs to schedule 6 week HTN recheck apt with Dr Livia Snellen

## 2018-05-14 ENCOUNTER — Encounter: Payer: Self-pay | Admitting: Family Medicine

## 2018-05-15 ENCOUNTER — Encounter: Payer: Self-pay | Admitting: Family Medicine

## 2018-05-15 ENCOUNTER — Other Ambulatory Visit: Payer: Self-pay

## 2018-05-15 ENCOUNTER — Ambulatory Visit (INDEPENDENT_AMBULATORY_CARE_PROVIDER_SITE_OTHER): Payer: Medicare Other | Admitting: Family Medicine

## 2018-05-15 DIAGNOSIS — N3001 Acute cystitis with hematuria: Secondary | ICD-10-CM

## 2018-05-15 MED ORDER — CIPROFLOXACIN HCL 500 MG PO TABS: 500.0000 mg | ORAL_TABLET | Freq: Two times a day (BID) | ORAL | 0 refills | Status: AC

## 2018-05-15 NOTE — Progress Notes (Signed)
Virtual Visit via telephone Note Due to COVID-19, visit is conducted virtually and was requested by patient. This visit type was conducted due to national recommendations for restrictions regarding the COVID-19 Pandemic (e.g. social distancing) in an effort to limit this patient's exposure and mitigate transmission in our community. All issues noted in this document were discussed and addressed.  A physical exam was not performed with this format.   I connected with [name] son 05/15/18 at 1325 by telephone and verified that I am speaking with the correct person using two identifiers. [name] is currently located at home and pt and family is currently with them during visit. The provider, Monia Pouch, FNP is located in their office at time of visit.  I discussed the limitations, risks, security and privacy concerns of performing an evaluation and management service by telephone and the availability of in person appointments. I also discussed with the patient that there may be a patient responsible charge related to this service. The patient expressed understanding and agreed to proceed.  Subjective:  Patient ID: [name], male    DOB: 06/08/1940, 78 y.o.   MRN: [id number]  Chief Complaint:  Dysuria   HPI: [name] is a 78 y.o. male presenting on 05/15/2018 for Dysuria   HPI provided by son as pt has dementia and is unable to provide information. Son states pt has been complaining of dysuria. States he has noticed some blood in his urine. States he has urgency and frequency. Pt denies back pain, flank pain, fever, chills, weakness, or increased confusion. States he does not have rectal pain or scrotal pain.   Dysuria   This is a new problem. The current episode started in the past 7 days. The problem occurs every urination. The problem has been unchanged. The quality of the pain is described as burning. The pain is mild. There has been no fever. He is not sexually  active. There is no history of pyelonephritis. Associated symptoms include frequency, hematuria and urgency. Pertinent negatives include no chills, discharge, flank pain, hesitancy, nausea, possible pregnancy, sweats or vomiting. He has tried nothing for the symptoms.     Relevant past medical, surgical, family, and social history reviewed and updated as indicated.  Allergies and medications reviewed and updated.   Past Medical History:  Diagnosis Date  . Dementia ([place])   . Hypertension   . Myocardial infarction [facility])     Past Surgical History:  Procedure Laterality Date  . CORONARY/GRAFT ACUTE MI REVASCULARIZATION N/A 01/30/2018   Procedure: Coronary/Graft Acute MI Revascularization;  Surgeon: Martinique, Peter M, MD;  Location: [facility];  Service: Cardiovascular;  Laterality: N/A;  . LEFT HEART CATH AND CORONARY ANGIOGRAPHY N/A 01/30/2018   Procedure: LEFT HEART CATH AND CORONARY ANGIOGRAPHY;  Surgeon: Martinique, Peter M, MD;  Location: [place] [facility];  Service: Cardiovascular;  Laterality: N/A;    Social History   Socioeconomic History  . Marital status: Divorced    Spouse name: Not on file  . Number of children: Not on file  . Years of education: Not on file  . Highest education level: Not on file  Occupational History  . Not on file  Social Needs  . Financial resource strain: Not on file  . Food insecurity:    Worry: Not on file    Inability: Not on file  . Transportation needs:    Medical: Not on file    Non-medical: Not on file  Tobacco Use  .  Smoking status: Former Smoker    Packs/day: 1.00    Years: 25.00    Pack years: 25.00    Types: Cigarettes    Last attempt to quit: 02/13/1988    Years since quitting: 30.2  . Smokeless tobacco: Never Used  Substance and Sexual Activity  . Alcohol use: Not Currently  . Drug use: Never  . Sexual activity: Not Currently  Lifestyle  . Physical activity:    Days per week: Not on file    Minutes per session: Not on  file  . Stress: Not on file  Relationships  . Social connections:    Talks on phone: Not on file    Gets together: Not on file    Attends religious service: Not on file    Active member of club or organization: Not on file    Attends meetings of clubs or organizations: Not on file    Relationship status: Not on file  . Intimate partner violence:    Fear of current or ex partner: Not on file    Emotionally abused: Not on file    Physically abused: Not on file    Forced sexual activity: Not on file  Other Topics Concern  . Not on file  Social History Narrative  . Not on file    Outpatient Encounter Medications as of 05/15/2018  Medication Sig  . aspirin 81 MG chewable tablet Chew 1 tablet (81 mg total) by mouth daily.  Marland Kitchen atorvastatin (LIPITOR) 80 MG tablet Take 1 tablet (80 mg total) by mouth daily at 6 PM.  . cetirizine (ZYRTEC) 10 MG tablet Take 10 mg by mouth daily.  . ciprofloxacin (CIPRO) 500 MG tablet Take 1 tablet (500 mg total) by mouth 2 (two) times daily for 7 days.  Marland Kitchen donepezil (ARICEPT) 10 MG tablet Take 1 tablet (10 mg total) by mouth at bedtime.  . Esomeprazole Magnesium (NEXIUM PO) Take 20 mg by mouth.  . furosemide (LASIX) 20 MG tablet TAKE ONE TABLET BY MOUTH DAILY  . losartan (COZAAR) 25 MG tablet TAKE ONE TABLET BY MOUTH DAILY  . memantine (NAMENDA) 10 MG tablet Take 1 tablet (10 mg total) by mouth daily.  . metoprolol succinate (TOPROL-XL) 25 MG 24 hr tablet Take 1 tablet (25 mg total) by mouth daily.  . nitroGLYCERIN (NITROSTAT) 0.4 MG SL tablet Place 1 tablet (0.4 mg total) under the tongue every 5 (five) minutes as needed.  . ticagrelor (BRILINTA) 90 MG TABS tablet Take 1 tablet (90 mg total) by mouth 2 (two) times daily.   No facility-administered encounter medications on file as of 05/15/2018.     No Known Allergies  Review of Systems  Constitutional: Negative for activity change, appetite change, chills, fatigue, fever and unexpected weight change.   Respiratory: Negative for cough and shortness of breath.   Cardiovascular: Negative for chest pain and palpitations.  Gastrointestinal: Negative for nausea and vomiting.  Genitourinary: Positive for dysuria, frequency, hematuria and urgency. Negative for decreased urine volume, difficulty urinating, discharge, enuresis, flank pain, genital sores, hesitancy, penile pain, penile swelling, scrotal swelling and testicular pain.  Neurological: Negative for syncope and weakness.  Psychiatric/Behavioral: Positive for confusion (baseline).  All other systems reviewed and are negative.        Observations/Objective: No vital signs or physical exam, this was a telephone or virtual health encounter.  Pt alert and oriented, answers all questions appropriately, and able to speak in full sentences.    Assessment and Plan: Tylor was seen  today for dysuria.  Diagnoses and all orders for this visit:  Acute cystitis with hematuria Reported symptoms consistent with acute cystitis. Will treat with below. Return in 2 weeks for reevaluation. If symptoms persist, will refer to urology.  -     ciprofloxacin (CIPRO) 500 MG tablet; Take 1 tablet (500 mg total) by mouth 2 (two) times daily for 7 days.     Follow Up Instructions: Return in about 2 weeks (around 05/29/2018) for UTI.    I discussed the assessment and treatment plan with the patient. The patient was provided an opportunity to ask questions and all were answered. The patient agreed with the plan and demonstrated an understanding of the instructions.   The patient was advised to call back or seek an in-person evaluation if the symptoms worsen or if the condition fails to improve as anticipated.  The above assessment and management plan was discussed with the patient. The patient verbalized understanding of and has agreed to the management plan. Patient is aware to call the clinic if symptoms persist or worsen. Patient is aware when to return to  the clinic for a follow-up visit. Patient educated on when it is appropriate to go to the emergency department.    I provided 15 minutes of non-face-to-face time during this encounter. The call started at 1325. The call ended at 1340. The other time was used for coordination of care.    Monia Pouch, FNP-C [facility] [address] [place], [place] [address] [phone]

## 2018-05-17 ENCOUNTER — Encounter: Payer: Self-pay | Admitting: Family Medicine

## 2018-05-17 ENCOUNTER — Ambulatory Visit (INDEPENDENT_AMBULATORY_CARE_PROVIDER_SITE_OTHER): Payer: Medicare Other | Admitting: Family Medicine

## 2018-05-17 ENCOUNTER — Other Ambulatory Visit: Payer: Self-pay

## 2018-05-17 ENCOUNTER — Ambulatory Visit (HOSPITAL_COMMUNITY)
Admission: RE | Admit: 2018-05-17 | Discharge: 2018-05-17 | Disposition: A | Discharge status: Home / Self Care | Payer: Medicare Other | Source: Ambulatory Visit | Attending: Family Medicine | Admitting: Family Medicine

## 2018-05-17 VITALS — BP 129/88 | HR 81 | Temp 97.2°F | Ht 71.0 in | Wt 198.4 lb

## 2018-05-17 DIAGNOSIS — Z7901 Long term (current) use of anticoagulants: Secondary | ICD-10-CM

## 2018-05-17 DIAGNOSIS — I10 Essential (primary) hypertension: Secondary | ICD-10-CM | POA: Diagnosis not present

## 2018-05-17 DIAGNOSIS — I255 Ischemic cardiomyopathy: Secondary | ICD-10-CM | POA: Diagnosis not present

## 2018-05-17 DIAGNOSIS — N39498 Other specified urinary incontinence: Secondary | ICD-10-CM

## 2018-05-17 DIAGNOSIS — R310 Gross hematuria: Secondary | ICD-10-CM

## 2018-05-17 DIAGNOSIS — I700 Atherosclerosis of aorta: Secondary | ICD-10-CM | POA: Diagnosis not present

## 2018-05-17 DIAGNOSIS — N1330 Unspecified hydronephrosis: Secondary | ICD-10-CM | POA: Diagnosis not present

## 2018-05-17 LAB — HEMOGLOBIN, FINGERSTICK: Hemoglobin: 12.2 g/dL — ABNORMAL LOW (ref 12.6–17.7)

## 2018-05-17 LAB — POCT I-STAT CREATININE: Creatinine, Ser: 1.9 mg/dL — ABNORMAL HIGH (ref 0.61–1.24)

## 2018-05-17 MED ORDER — DONEPEZIL HCL 10 MG PO TABS: 5.0000 mg | ORAL_TABLET | Freq: Every day | ORAL | 1 refills | Status: DC

## 2018-05-17 MED ORDER — IOHEXOL 300 MG/ML  SOLN
100.0000 mL | Freq: Once | INTRAMUSCULAR | Status: AC | PRN
  Administered 2018-05-17: 100 mL via INTRAVENOUS

## 2018-05-17 MED ORDER — AMOXICILLIN 875 MG PO TABS: 875.0000 mg | ORAL_TABLET | Freq: Two times a day (BID) | ORAL | 0 refills | Status: AC

## 2018-05-17 NOTE — Patient Instructions (Signed)
Drink 3-4 bottles of water daily

## 2018-05-17 NOTE — Progress Notes (Signed)
Subjective:  Patient ID: [name], male    DOB: 05/15/1940  Age: 78 y.o. MRN: [id number]  CC: Urinary Incontinence   HPI [name] presents for spilling large amounts of blood with urination thtree nights ago. Could not control it. "Made a mess everywhere," says his son. Subsequently has been unable to hold his urine. Phone visit done two days ago. Pt. sstarted on cipro for possible infection. No relief as of presentation.  Has had a lot of blood in it except less this morning. He is not taking a lot of fluids. Appetite not good. Son fears dehydration. Mentally less alert although he does have dementia.  Depression screen PHQ 2/9 05/17/2018  Decreased Interest 0  Down, Depressed, Hopeless 0  PHQ - 2 Score 0    History [name] has a past medical history of Dementia ([name]), Hypertension, and Myocardial infarction ([name]).   He has a past surgical history that includes Coronary/Graft Acute MI Revascularization (N/A, 01/30/2018) and LEFT HEART CATH AND CORONARY ANGIOGRAPHY (N/A, 01/30/2018).   His family history includes Arthritis in his mother and son; Asthma in his son; Depression in his sister; Diabetes in his mother and son; Heart disease in his paternal grandfather; Hypertension in his mother; Obesity in his mother and son; Pulmonary embolism in his sister; Stroke in his father and mother.He reports that he quit smoking about 30 years ago. His smoking use included cigarettes. He has a 25.00 pack-year smoking history. He has never used smokeless tobacco. He reports previous alcohol use. He reports that he does not use drugs.    ROS Review of Systems  Constitutional: Positive for activity change.  HENT: Negative.   Eyes: Negative for visual disturbance.  Respiratory: Negative for cough and shortness of breath.   Cardiovascular: Negative for chest pain.  Gastrointestinal: Negative for abdominal pain, diarrhea, nausea and vomiting.  Genitourinary: Positive for difficulty urinating,  dysuria, enuresis, frequency, hematuria and urgency. Negative for decreased urine volume, penile pain, penile swelling and testicular pain.  Musculoskeletal: Negative for arthralgias and myalgias.  Skin: Negative for rash.  Neurological: Negative for headaches.  Psychiatric/Behavioral: Positive for confusion and decreased concentration. The patient is nervous/anxious.     Objective:  BP 129/88   Pulse 81   Temp (!) 97.2 F (36.2 C) (Oral)   Ht _0  (1.803 m)   Wt 198 lb 6.4 oz (90 kg)   BMI 27.67 kg/m   BP Readings from Last 3 Encounters:  05/19/18 105/66  05/17/18 129/88  02/19/18 (!) 152/80    Wt Readings from Last 3 Encounters:  05/18/18 198 lb (89.8 kg)  05/17/18 198 lb 6.4 oz (90 kg)  02/19/18 199 lb 6.4 oz (90.4 kg)     Physical Exam Constitutional:      General: He is not in acute distress.    Appearance: He is well-developed. He is ill-appearing.  HENT:     Head: Normocephalic and atraumatic.     Right Ear: External ear normal.     Left Ear: External ear normal.     Nose: Nose normal.  Eyes:     Conjunctiva/sclera: Conjunctivae normal.     Pupils: Pupils are equal, round, and reactive to light.  Neck:     Musculoskeletal: Normal range of motion and neck supple.  Cardiovascular:     Rate and Rhythm: Normal rate and regular rhythm.     Heart sounds: Normal heart sounds. No murmur.  Pulmonary:     Effort: Pulmonary effort is normal. No  respiratory distress.     Breath sounds: Normal breath sounds. No wheezing or rales.  Abdominal:     Palpations: Abdomen is soft.     Tenderness: There is no abdominal tenderness.  Musculoskeletal: Normal range of motion.  Skin:    General: Skin is warm and dry.  Neurological:     General: No focal deficit present.     Mental Status: He is alert. He is disoriented.     Motor: Weakness (nonfocal) present.     Deep Tendon Reflexes: Reflexes are normal and symmetric.  Psychiatric:        Attention and Perception: He is  inattentive.        Mood and Affect: Affect is blunt.        Speech: He is noncommunicative. Speech is slurred.        Behavior: Behavior is cooperative.        Cognition and Memory: Cognition is impaired.       Assessment & Plan:   [name] was seen today for urinary incontinence.  Diagnoses and all orders for this visit:  Other urinary incontinence -     Cancel: Urinalysis, Complete -     Urine Culture -     CBC with Differential/Platelet -     CMP14+EGFR -     Hemoglobin, fingerstick -     Cancel: CT Abdomen Pelvis W Contrast; Future -     PSA Total (Reflex To Free) -     Ambulatory referral to Urology -     CT ABDOMEN PELVIS W [place]; Future  Gross hematuria -     CBC with Differential/Platelet -     CMP14+EGFR -     Hemoglobin, fingerstick -     Cancel: CT Abdomen Pelvis W Contrast; Future -     PSA Total (Reflex To Free) -     Ambulatory referral to Urology -     CT ABDOMEN PELVIS W WO CONTRAST; Future  Essential hypertension -     CBC with Differential/Platelet -     CMP14+EGFR -     Hemoglobin, fingerstick -     Cancel: CT Abdomen Pelvis W Contrast; Future -     PSA Total (Reflex To Free) -     Ambulatory referral to Urology -     CT ABDOMEN PELVIS W [place]; Future  Anticoagulated -     CBC with Differential/Platelet -     CMP14+EGFR -     Hemoglobin, fingerstick -     Cancel: CT Abdomen Pelvis W Contrast; Future -     PSA Total (Reflex To Free) -     Ambulatory referral to Urology -     CT ABDOMEN PELVIS W [place]; Future  Other orders -     donepezil (ARICEPT) 10 MG tablet; Take 0.5 tablets (5 mg total) by mouth at bedtime. -     amoxicillin (AMOXIL) 875 MG tablet; Take 1 tablet (875 mg total) by mouth 2 (two) times daily for 10 days.       I have discontinued [name] "[name]"'s ticagrelor and furosemide. I have also changed his donepezil. Additionally, I am having him start on amoxicillin. Lastly, I am having him maintain his  aspirin, atorvastatin, metoprolol succinate, nitroGLYCERIN, cetirizine, Esomeprazole Magnesium (NEXIUM PO), memantine, losartan, and ciprofloxacin.  Allergies as of 05/17/2018   No Known Allergies     Medication List       Accurate as of May 17, 2018 11:59 PM.  If you have any questions, ask your nurse or doctor.        STOP taking these medications   furosemide 20 MG tablet Commonly known as:  LASIX Stopped by:  Claretta Fraise, MD   ticagrelor 90 MG Tabs tablet Commonly known as:  BRILINTA Stopped by:  Claretta Fraise, MD     TAKE these medications   amoxicillin 875 MG tablet Commonly known as:  AMOXIL Take 1 tablet (875 mg total) by mouth 2 (two) times daily for 10 days. Started by:  Claretta Fraise, MD   aspirin 81 MG chewable tablet Chew 1 tablet (81 mg total) by mouth daily.   atorvastatin 80 MG tablet Commonly known as:  LIPITOR Take 1 tablet (80 mg total) by mouth daily at 6 PM.   cetirizine 10 MG tablet Commonly known as:  ZYRTEC Take 10 mg by mouth daily.   ciprofloxacin 500 MG tablet Commonly known as:  Cipro Take 1 tablet (500 mg total) by mouth 2 (two) times daily for 7 days.   donepezil 10 MG tablet Commonly known as:  ARICEPT Take 0.5 tablets (5 mg total) by mouth at bedtime. What changed:  how much to take Changed by:  Claretta Fraise, MD   losartan 25 MG tablet Commonly known as:  COZAAR TAKE ONE TABLET BY MOUTH DAILY   memantine 10 MG tablet Commonly known as:  NAMENDA Take 1 tablet (10 mg total) by mouth daily.   metoprolol succinate 25 MG 24 hr tablet Commonly known as:  TOPROL-XL Take 1 tablet (25 mg total) by mouth daily.   NEXIUM PO Take 20 mg by mouth.   nitroGLYCERIN 0.4 MG SL tablet Commonly known as:  Nitrostat Place 1 tablet (0.4 mg total) under the tongue every 5 (five) minutes as needed.      CT abd showed renal carcinoma. Pt. Admitted for acute hemorrhage via hematuria.   Follow-up: Return in about 1 week (around  05/24/2018).  Claretta Fraise, M.D.

## 2018-05-18 ENCOUNTER — Telehealth: Payer: Self-pay | Admitting: Family Medicine

## 2018-05-18 ENCOUNTER — Observation Stay (HOSPITAL_COMMUNITY)
Admission: RE | Admit: 2018-05-18 | Discharge: 2018-05-19 | Disposition: A | Discharge status: Home / Self Care | Payer: Medicare Other | Attending: Urology | Admitting: Urology

## 2018-05-18 ENCOUNTER — Other Ambulatory Visit: Payer: Self-pay | Admitting: Urology

## 2018-05-18 ENCOUNTER — Other Ambulatory Visit: Payer: Self-pay

## 2018-05-18 ENCOUNTER — Ambulatory Visit (HOSPITAL_COMMUNITY): Payer: Medicare Other | Admitting: Anesthesiology

## 2018-05-18 ENCOUNTER — Other Ambulatory Visit (HOSPITAL_COMMUNITY)
Admission: RE | Admit: 2018-05-18 | Discharge: 2018-05-18 | Disposition: A | Discharge status: Home / Self Care | Payer: Medicare Other | Source: Ambulatory Visit | Attending: Urology | Admitting: Urology

## 2018-05-18 ENCOUNTER — Encounter (HOSPITAL_COMMUNITY)
Admission: RE | Disposition: A | Discharge status: Home / Self Care | Payer: Self-pay | Source: Home / Self Care | Attending: Urology

## 2018-05-18 ENCOUNTER — Encounter (HOSPITAL_COMMUNITY): Payer: Self-pay | Admitting: Certified Registered Nurse Anesthetist

## 2018-05-18 DIAGNOSIS — N2889 Other specified disorders of kidney and ureter: Secondary | ICD-10-CM | POA: Insufficient documentation

## 2018-05-18 DIAGNOSIS — I2510 Atherosclerotic heart disease of native coronary artery without angina pectoris: Secondary | ICD-10-CM | POA: Diagnosis not present

## 2018-05-18 DIAGNOSIS — Z1159 Encounter for screening for other viral diseases: Secondary | ICD-10-CM | POA: Diagnosis not present

## 2018-05-18 DIAGNOSIS — R319 Hematuria, unspecified: Secondary | ICD-10-CM | POA: Diagnosis present

## 2018-05-18 DIAGNOSIS — K219 Gastro-esophageal reflux disease without esophagitis: Secondary | ICD-10-CM | POA: Insufficient documentation

## 2018-05-18 DIAGNOSIS — I509 Heart failure, unspecified: Secondary | ICD-10-CM | POA: Diagnosis not present

## 2018-05-18 DIAGNOSIS — I110 Hypertensive heart disease with heart failure: Secondary | ICD-10-CM | POA: Insufficient documentation

## 2018-05-18 DIAGNOSIS — R310 Gross hematuria: Principal | ICD-10-CM | POA: Insufficient documentation

## 2018-05-18 DIAGNOSIS — F0390 Unspecified dementia without behavioral disturbance: Secondary | ICD-10-CM | POA: Diagnosis not present

## 2018-05-18 DIAGNOSIS — Z87891 Personal history of nicotine dependence: Secondary | ICD-10-CM | POA: Diagnosis not present

## 2018-05-18 DIAGNOSIS — R338 Other retention of urine: Secondary | ICD-10-CM | POA: Diagnosis not present

## 2018-05-18 DIAGNOSIS — N3289 Other specified disorders of bladder: Secondary | ICD-10-CM | POA: Diagnosis not present

## 2018-05-18 DIAGNOSIS — D49512 Neoplasm of unspecified behavior of left kidney: Secondary | ICD-10-CM | POA: Diagnosis not present

## 2018-05-18 DIAGNOSIS — R339 Retention of urine, unspecified: Secondary | ICD-10-CM | POA: Insufficient documentation

## 2018-05-18 DIAGNOSIS — I252 Old myocardial infarction: Secondary | ICD-10-CM | POA: Insufficient documentation

## 2018-05-18 DIAGNOSIS — Z79899 Other long term (current) drug therapy: Secondary | ICD-10-CM | POA: Diagnosis not present

## 2018-05-18 DIAGNOSIS — Z9861 Coronary angioplasty status: Secondary | ICD-10-CM | POA: Insufficient documentation

## 2018-05-18 HISTORY — PX: CYSTOSCOPY WITH RETROGRADE PYELOGRAM, URETEROSCOPY AND STENT PLACEMENT: SHX5789

## 2018-05-18 HISTORY — PX: TRANSURETHRAL RESECTION OF BLADDER TUMOR: SHX2575

## 2018-05-18 LAB — TYPE AND SCREEN
ABO/RH(D): O POS
Antibody Screen: NEGATIVE

## 2018-05-18 LAB — CBC
HCT: 38.9 % — ABNORMAL LOW (ref 39.0–52.0)
Hemoglobin: 12.8 g/dL — ABNORMAL LOW (ref 13.0–17.0)
MCH: 30.3 pg (ref 26.0–34.0)
MCHC: 32.9 g/dL (ref 30.0–36.0)
MCV: 92 fL (ref 80.0–100.0)
Platelets: 185 10*3/uL (ref 150–400)
RBC: 4.23 MIL/uL (ref 4.22–5.81)
RDW: 14.4 % (ref 11.5–15.5)
WBC: 10.8 10*3/uL — ABNORMAL HIGH (ref 4.0–10.5)
nRBC: 0 % (ref 0.0–0.2)

## 2018-05-18 LAB — CBC WITH DIFFERENTIAL/PLATELET
Basophils Absolute: 0 10*3/uL (ref 0.0–0.2)
Basos: 0 %
EOS (ABSOLUTE): 0.1 10*3/uL (ref 0.0–0.4)
Eos: 1 %
Hematocrit: 38.7 % (ref 37.5–51.0)
Hemoglobin: 13.2 g/dL (ref 13.0–17.7)
Immature Grans (Abs): 0.1 10*3/uL (ref 0.0–0.1)
Immature Granulocytes: 1 %
Lymphocytes Absolute: 0.9 10*3/uL (ref 0.7–3.1)
Lymphs: 9 %
MCH: 29.6 pg (ref 26.6–33.0)
MCHC: 34.1 g/dL (ref 31.5–35.7)
MCV: 87 fL (ref 79–97)
Monocytes Absolute: 1.1 10*3/uL — ABNORMAL HIGH (ref 0.1–0.9)
Monocytes: 11 %
Neutrophils Absolute: 7.2 10*3/uL — ABNORMAL HIGH (ref 1.4–7.0)
Neutrophils: 78 %
Platelets: 209 10*3/uL (ref 150–450)
RBC: 4.46 x10E6/uL (ref 4.14–5.80)
RDW: 13.3 % (ref 11.6–15.4)
WBC: 9.2 10*3/uL (ref 3.4–10.8)

## 2018-05-18 LAB — CMP14+EGFR
ALT: 19 IU/L (ref 0–44)
AST: 32 IU/L (ref 0–40)
Albumin/Globulin Ratio: 1.6 (ref 1.2–2.2)
Albumin: 4.1 g/dL (ref 3.7–4.7)
Alkaline Phosphatase: 94 IU/L (ref 39–117)
BUN/Creatinine Ratio: 18 (ref 10–24)
BUN: 38 mg/dL — ABNORMAL HIGH (ref 8–27)
Bilirubin Total: 1.8 mg/dL — ABNORMAL HIGH (ref 0.0–1.2)
CO2: 25 mmol/L (ref 20–29)
Calcium: 9.1 mg/dL (ref 8.6–10.2)
Chloride: 97 mmol/L (ref 96–106)
Creatinine, Ser: 2.06 mg/dL — ABNORMAL HIGH (ref 0.76–1.27)
GFR calc Af Amer: 35 mL/min/{1.73_m2} — ABNORMAL LOW (ref >59)
GFR calc non Af Amer: 31 mL/min/{1.73_m2} — ABNORMAL LOW (ref >59)
Globulin, Total: 2.6 g/dL (ref 1.5–4.5)
Glucose: 121 mg/dL — ABNORMAL HIGH (ref 65–99)
Potassium: 3.9 mmol/L (ref 3.5–5.2)
Sodium: 137 mmol/L (ref 134–144)
Total Protein: 6.7 g/dL (ref 6.0–8.5)

## 2018-05-18 LAB — BASIC METABOLIC PANEL
Anion gap: 13 (ref 5–15)
BUN: 57 mg/dL — ABNORMAL HIGH (ref 8–23)
CO2: 24 mmol/L (ref 22–32)
Calcium: 8.7 mg/dL — ABNORMAL LOW (ref 8.9–10.3)
Chloride: 97 mmol/L — ABNORMAL LOW (ref 98–111)
Creatinine, Ser: 3.28 mg/dL — ABNORMAL HIGH (ref 0.61–1.24)
GFR calc Af Amer: 20 mL/min — ABNORMAL LOW (ref >60)
GFR calc non Af Amer: 17 mL/min — ABNORMAL LOW (ref >60)
Glucose, Bld: 119 mg/dL — ABNORMAL HIGH (ref 70–99)
Potassium: 3.5 mmol/L (ref 3.5–5.1)
Sodium: 134 mmol/L — ABNORMAL LOW (ref 135–145)

## 2018-05-18 LAB — SARS CORONAVIRUS 2 BY RT PCR (HOSPITAL ORDER, PERFORMED IN ~~LOC~~ HOSPITAL LAB): SARS Coronavirus 2: NEGATIVE

## 2018-05-18 LAB — PROTIME-INR
INR: 1.3 — ABNORMAL HIGH (ref 0.8–1.2)
Prothrombin Time: 15.9 seconds — ABNORMAL HIGH (ref 11.4–15.2)

## 2018-05-18 LAB — PSA TOTAL (REFLEX TO FREE): Prostate Specific Ag, Serum: 1.8 ng/mL (ref 0.0–4.0)

## 2018-05-18 SURGERY — CYSTOURETEROSCOPY, WITH RETROGRADE PYELOGRAM AND STENT INSERTION
Anesthesia: General

## 2018-05-18 MED ORDER — SODIUM CHLORIDE 0.9 % IV SOLN
INTRAVENOUS | Status: DC
  Administered 2018-05-18: 21:00:00 via INTRAVENOUS

## 2018-05-18 MED ORDER — OXYBUTYNIN CHLORIDE 5 MG PO TABS: 5.0000 mg | ORAL_TABLET | Freq: Three times a day (TID) | ORAL | Status: DC | PRN

## 2018-05-18 MED ORDER — DIPHENHYDRAMINE HCL 12.5 MG/5ML PO ELIX
12.5000 mg | ORAL_SOLUTION | Freq: Four times a day (QID) | ORAL | Status: DC | PRN
  Filled 2018-05-18: qty 5

## 2018-05-18 MED ORDER — CEPHALEXIN 500 MG PO CAPS
500.0000 mg | ORAL_CAPSULE | Freq: Three times a day (TID) | ORAL | Status: DC
  Administered 2018-05-18 – 2018-05-19 (×2): 500 mg via ORAL
  Filled 2018-05-18 (×2): qty 1

## 2018-05-18 MED ORDER — LOSARTAN POTASSIUM 25 MG PO TABS
25.0000 mg | ORAL_TABLET | Freq: Every day | ORAL | Status: DC
  Administered 2018-05-19: 25 mg via ORAL
  Filled 2018-05-18: qty 1

## 2018-05-18 MED ORDER — OXYCODONE HCL 5 MG PO TABS: 5.0000 mg | ORAL_TABLET | ORAL | Status: DC | PRN

## 2018-05-18 MED ORDER — CEFAZOLIN SODIUM-DEXTROSE 2-4 GM/100ML-% IV SOLN
2.0000 g | INTRAVENOUS | Status: AC
  Administered 2018-05-18: 18:00:00 2 g via INTRAVENOUS
  Filled 2018-05-18: qty 100

## 2018-05-18 MED ORDER — MORPHINE SULFATE (PF) 4 MG/ML IV SOLN: 2.0000 mg | INTRAVENOUS | Status: DC | PRN

## 2018-05-18 MED ORDER — DONEPEZIL HCL 5 MG PO TABS
5.0000 mg | ORAL_TABLET | Freq: Every day | ORAL | Status: DC
  Administered 2018-05-18: 5 mg via ORAL
  Filled 2018-05-18: qty 1

## 2018-05-18 MED ORDER — PROPOFOL 10 MG/ML IV BOLUS
INTRAVENOUS | Status: AC
  Filled 2018-05-18: qty 20

## 2018-05-18 MED ORDER — PHENYLEPHRINE 40 MCG/ML (10ML) SYRINGE FOR IV PUSH (FOR BLOOD PRESSURE SUPPORT)
PREFILLED_SYRINGE | INTRAVENOUS | Status: AC
  Filled 2018-05-18: qty 10

## 2018-05-18 MED ORDER — EPHEDRINE 5 MG/ML INJ
INTRAVENOUS | Status: AC
  Filled 2018-05-18: qty 10

## 2018-05-18 MED ORDER — PROPOFOL 10 MG/ML IV BOLUS
INTRAVENOUS | Status: DC | PRN
  Administered 2018-05-18: 80 mg via INTRAVENOUS

## 2018-05-18 MED ORDER — SODIUM CHLORIDE 0.9 % IR SOLN
3000.0000 mL | Status: DC
  Administered 2018-05-18 – 2018-05-19 (×3): 3000 mL

## 2018-05-18 MED ORDER — LIDOCAINE 2% (20 MG/ML) 5 ML SYRINGE
INTRAMUSCULAR | Status: DC | PRN
  Administered 2018-05-18: 60 mg via INTRAVENOUS

## 2018-05-18 MED ORDER — ONDANSETRON HCL 4 MG/2ML IJ SOLN: 4.0000 mg | INTRAMUSCULAR | Status: DC | PRN

## 2018-05-18 MED ORDER — ONDANSETRON HCL 4 MG/2ML IJ SOLN
INTRAMUSCULAR | Status: DC | PRN
  Administered 2018-05-18: 4 mg via INTRAVENOUS

## 2018-05-18 MED ORDER — MEMANTINE HCL 10 MG PO TABS
10.0000 mg | ORAL_TABLET | Freq: Every day | ORAL | Status: DC
  Administered 2018-05-19: 10 mg via ORAL
  Filled 2018-05-18: qty 1

## 2018-05-18 MED ORDER — SENNA 8.6 MG PO TABS
1.0000 | ORAL_TABLET | Freq: Two times a day (BID) | ORAL | Status: DC
  Administered 2018-05-18 – 2018-05-19 (×2): 8.6 mg via ORAL
  Filled 2018-05-18 (×2): qty 1

## 2018-05-18 MED ORDER — DOCUSATE SODIUM 100 MG PO CAPS
100.0000 mg | ORAL_CAPSULE | Freq: Two times a day (BID) | ORAL | Status: DC
  Administered 2018-05-18 – 2018-05-19 (×2): 100 mg via ORAL
  Filled 2018-05-18 (×2): qty 1

## 2018-05-18 MED ORDER — ASPIRIN 81 MG PO CHEW
81.0000 mg | CHEWABLE_TABLET | Freq: Every day | ORAL | Status: DC
  Administered 2018-05-19: 10:00:00 81 mg via ORAL
  Filled 2018-05-18: qty 1

## 2018-05-18 MED ORDER — FENTANYL CITRATE (PF) 100 MCG/2ML IJ SOLN
INTRAMUSCULAR | Status: DC | PRN
  Administered 2018-05-18: 25 ug via INTRAVENOUS
  Administered 2018-05-18: 50 ug via INTRAVENOUS
  Administered 2018-05-18: 25 ug via INTRAVENOUS

## 2018-05-18 MED ORDER — BELLADONNA ALKALOIDS-OPIUM 16.2-60 MG RE SUPP: 1.0000 | Freq: Four times a day (QID) | RECTAL | Status: DC | PRN

## 2018-05-18 MED ORDER — PHENYLEPHRINE 40 MCG/ML (10ML) SYRINGE FOR IV PUSH (FOR BLOOD PRESSURE SUPPORT)
PREFILLED_SYRINGE | INTRAVENOUS | Status: DC | PRN
  Administered 2018-05-18 (×6): 80 ug via INTRAVENOUS
  Administered 2018-05-18: 120 ug via INTRAVENOUS
  Administered 2018-05-18 (×2): 80 ug via INTRAVENOUS

## 2018-05-18 MED ORDER — DIPHENHYDRAMINE HCL 50 MG/ML IJ SOLN: 12.5000 mg | Freq: Four times a day (QID) | INTRAMUSCULAR | Status: DC | PRN

## 2018-05-18 MED ORDER — FENTANYL CITRATE (PF) 100 MCG/2ML IJ SOLN
INTRAMUSCULAR | Status: AC
  Filled 2018-05-18: qty 2

## 2018-05-18 MED ORDER — EPHEDRINE SULFATE-NACL 50-0.9 MG/10ML-% IV SOSY
PREFILLED_SYRINGE | INTRAVENOUS | Status: DC | PRN
  Administered 2018-05-18 (×3): 5 mg via INTRAVENOUS

## 2018-05-18 MED ORDER — LORATADINE 10 MG PO TABS
10.0000 mg | ORAL_TABLET | Freq: Every day | ORAL | Status: DC
  Administered 2018-05-19: 10:00:00 10 mg via ORAL
  Filled 2018-05-18: qty 1

## 2018-05-18 MED ORDER — METOPROLOL SUCCINATE ER 25 MG PO TB24
25.0000 mg | ORAL_TABLET | Freq: Every day | ORAL | Status: DC
  Administered 2018-05-19: 10:00:00 25 mg via ORAL
  Filled 2018-05-18: qty 1

## 2018-05-18 MED ORDER — LACTATED RINGERS IV SOLN
INTRAVENOUS | Status: DC
  Administered 2018-05-18: 18:00:00 via INTRAVENOUS

## 2018-05-18 MED ORDER — ATORVASTATIN CALCIUM 40 MG PO TABS: 80.0000 mg | ORAL_TABLET | Freq: Every day | ORAL | Status: DC

## 2018-05-18 MED ORDER — PANTOPRAZOLE SODIUM 40 MG PO TBEC
40.0000 mg | DELAYED_RELEASE_TABLET | Freq: Every day | ORAL | Status: DC
  Administered 2018-05-19: 40 mg via ORAL
  Filled 2018-05-18: qty 1

## 2018-05-18 MED ORDER — SODIUM CHLORIDE 0.9 % IR SOLN
Status: DC | PRN
  Administered 2018-05-18 (×2): 3000 mL via INTRAVESICAL

## 2018-05-18 MED ORDER — ACETAMINOPHEN 325 MG PO TABS: 650.0000 mg | ORAL_TABLET | ORAL | Status: DC | PRN

## 2018-05-18 MED ORDER — DEXAMETHASONE SODIUM PHOSPHATE 10 MG/ML IJ SOLN
INTRAMUSCULAR | Status: DC | PRN
  Administered 2018-05-18: 5 mg via INTRAVENOUS

## 2018-05-18 SURGICAL SUPPLY — 31 items
BAG URINE DRAINAGE (UROLOGICAL SUPPLIES) IMPLANT
BAG URO CATCHER STRL LF (MISCELLANEOUS) ×3 IMPLANT
BASKET LASER NITINOL 1.9FR (BASKET) IMPLANT
BASKET ZERO TIP NITINOL 2.4FR (BASKET) IMPLANT
CATH FOLEY 3WAY 30CC 24FR (CATHETERS) ×2
CATH INTERMIT  6FR 70CM (CATHETERS) ×2 IMPLANT
CATH URET 5FR 28IN CONE TIP (BALLOONS)
CATH URET 5FR 70CM CONE TIP (BALLOONS) IMPLANT
CATH URTH STD 24FR FL 3W 2 (CATHETERS) IMPLANT
CLOTH BEACON ORANGE TIMEOUT ST (SAFETY) ×3 IMPLANT
COVER WAND RF STERILE (DRAPES) IMPLANT
ELECT REM PT RETURN 15FT ADLT (MISCELLANEOUS) ×3 IMPLANT
EXTRACTOR STONE 1.7FRX115CM (UROLOGICAL SUPPLIES) IMPLANT
FIBER LASER FLEXIVA 365 (UROLOGICAL SUPPLIES) IMPLANT
FIBER LASER TRAC TIP (UROLOGICAL SUPPLIES) IMPLANT
GLOVE BIO SURGEON STRL SZ7.5 (GLOVE) ×3 IMPLANT
GOWN STRL REUS W/TWL LRG LVL3 (GOWN DISPOSABLE) ×3 IMPLANT
GOWN STRL REUS W/TWL XL LVL3 (GOWN DISPOSABLE) ×3 IMPLANT
GUIDEWIRE ANG ZIPWIRE 038X150 (WIRE) IMPLANT
GUIDEWIRE STR DUAL SENSOR (WIRE) ×3 IMPLANT
KIT TURNOVER KIT A (KITS) IMPLANT
LOOP CUT BIPOLAR 24F LRG (ELECTROSURGICAL) ×2 IMPLANT
MANIFOLD NEPTUNE II (INSTRUMENTS) ×3 IMPLANT
PACK CYSTO (CUSTOM PROCEDURE TRAY) ×3 IMPLANT
SET ASPIRATION TUBING (TUBING) IMPLANT
SHEATH URETERAL 12FRX28CM (UROLOGICAL SUPPLIES) IMPLANT
SHEATH URETERAL 12FRX35CM (MISCELLANEOUS) IMPLANT
SYRINGE IRR TOOMEY STRL 70CC (SYRINGE) ×2 IMPLANT
TUBING CONNECTING 10 (TUBING) ×2 IMPLANT
TUBING CONNECTING 10' (TUBING) ×1
TUBING UROLOGY SET (TUBING) ×3 IMPLANT

## 2018-05-18 NOTE — H&P (Signed)
CC/HPI: Cc: Gross hematuria, left renal mass.  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 He started having gross hematuria several days ago. He is on Brillenta for a history of cardiovascular disease. He had a balloon angioplasty of his heart back in January. He was told to stop his blood thinner yesterday. His last dose was yesterday. He underwent a CT hematuria protocol yesterday. This revealed a large amount of clot in his bladder with evidence of retention. 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 His son provides most of the history given his history of dementia.     ALLERGIES: None   MEDICATIONS: Cipro  Metoprolol Tartrate  Nexium  Zyrtec  Amoxicillin 875 mg tablet  Aricept  Aspir 81  Brilinta  Cozaar  Lasix  Namenda     GU PSH: None   NON-GU PSH: None   GU PMH: None   NON-GU PMH: GERD    FAMILY HISTORY: 1 son - Other stroke - Father, Mother   SOCIAL HISTORY: Marital Status: Single Preferred Language: English; Race: White Current Smoking Status: Patient does not smoke anymore. Has not smoked since 05/04/1983.   Tobacco Use Assessment Completed: Used Tobacco in last 30 days? Drinks 4+ caffeinated drinks per day.    REVIEW OF SYSTEMS:    GU Review Male:   Patient reports frequent urination, hard to postpone urination, burning/ pain with urination, get up at night to urinate, leakage of urine, stream starts and stops, trouble starting your stream, have to strain to urinate , and erection problems. Patient denies penile pain.  Gastrointestinal (Upper):   Patient denies nausea, vomiting, and indigestion/ heartburn.  Gastrointestinal (Lower):   Patient denies diarrhea and constipation.  Constitutional:   Patient reports fatigue. Patient denies fever, night sweats, and weight loss.  Skin:   Patient denies skin rash/ lesion and itching.  Eyes:   Patient denies blurred vision and double vision.  Ears/ Nose/ Throat:    Patient denies sore throat and sinus problems.  Hematologic/Lymphatic:   Patient reports easy bruising. Patient denies swollen glands.  Cardiovascular:   Patient denies leg swelling and chest pains.  Respiratory:   Patient denies cough and shortness of breath.  Endocrine:   Patient denies excessive thirst.  Musculoskeletal:   Patient reports back pain. Patient denies joint pain.  Neurological:   Patient denies headaches and dizziness.  Psychologic:   Patient denies anxiety and depression.   Notes: hematuria UTI weak stream    VITAL SIGNS:      05/18/2018 01:52 PM  BP 114/79 mmHg  Heart Rate 81 /min  Temperature 97.9 F / 36.6 C   MULTI-SYSTEM PHYSICAL EXAMINATION:    Constitutional: Well-nourished. No physical deformities. Normally developed. Good grooming.  Respiratory: No labored breathing, no use of accessory muscles.   Cardiovascular: Normal temperature, adequate perfusion of extremities  Skin: No paleness, no jaundice  Neurologic / Psychiatric: Moderately verbal, follows commands.  Gastrointestinal: No mass, no tenderness, no rigidity, non obese abdomen.  Eyes: Normal conjunctivae. Normal eyelids.  Musculoskeletal: Normal gait and station of head and neck.     PAST DATA REVIEWED:  Source Of History:  Patient  Records Review:   Previous Patient Records  X-Ray Review: C.T. Abdomen/Pelvis: Reviewed Films. Reviewed Report. Discussed With Patient.     PROCEDURES:          Urinalysis w/Scope Dipstick Dipstick Cont'd Micro  Color: Red Bilirubin: Invalid mg/dL WBC/hpf: 20 - 40/hpf  Appearance:  Turbid Ketones: Invalid mg/dL RBC/hpf: Packed/hpf  Specific Gravity: Invalid Blood: Invalid ery/uL Bacteria: Few (10-25/hpf)  pH: Invalid Protein: Invalid mg/dL Cystals: NS (Not Seen)  Glucose: Invalid mg/dL Urobilinogen: Invalid mg/dL Casts: NS (Not Seen)    Nitrites: Invalid Trichomonas: Not Present    Leukocyte Esterase: Invalid leu/uL Mucous: Not Present      Epithelial Cells: NS  (Not Seen)      Yeast: NS (Not Seen)      Sperm: Not Present    Notes: invalid due to discoloration unspun microscopic performed    ASSESSMENT:      ICD-10 Details  1 GU:   Gross hematuria - R31.0   2   Urinary Retention - J19.4   3   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   Cc: Dr. Jannetta Quint, M.D.    Signed by Link Snuffer, III, M.D. on 05/18/18 at 2:46 PM (EDT

## 2018-05-18 NOTE — Telephone Encounter (Signed)
Spoke with son pertaining to CT results. Appointment with Dr. Gloriann Loan at [facility] today at 1300.

## 2018-05-18 NOTE — Anesthesia Procedure Notes (Signed)
Procedure Name: LMA Insertion Date/Time: 05/18/2018 6:12 PM Performed by: Claudia Desanctis, CRNA Pre-anesthesia Checklist: Emergency Drugs available, Patient identified, Suction available and Patient being monitored Patient Re-evaluated:Patient Re-evaluated prior to induction Oxygen Delivery Method: Circle system utilized Preoxygenation: Pre-oxygenation with 100% oxygen Induction Type: IV induction LMA: LMA inserted LMA Size: 4.0 Number of attempts: 1 Placement Confirmation: positive ETCO2 and breath sounds checked- equal and bilateral Tube secured with: Tape Dental Injury: Teeth and Oropharynx as per pre-operative assessment

## 2018-05-18 NOTE — Transfer of Care (Signed)
Immediate Anesthesia Transfer of Care Note  Patient: [name]  Procedure(s) Performed: CYSTOSCOPY (N/A ) TRANSURETHRAL RESECTION OF BLADDER TUMOR (TURBT), CLOT EVACUATION (N/A )  Patient Location: PACU  Anesthesia Type:General  Level of Consciousness: awake and patient cooperative  Airway & Oxygen Therapy: Patient Spontanous Breathing and Patient connected to face mask  Post-op Assessment: Report given to RN and Post -op Vital signs reviewed and stable  Post vital signs: Reviewed and stable  Last Vitals:  Vitals Value Taken Time  BP 135/72 05/18/2018  7:15 PM  Temp    Pulse 83 05/18/2018  7:16 PM  Resp 10 05/18/2018  7:16 PM  SpO2 99 % 05/18/2018  7:16 PM  Vitals shown include unvalidated device data.  Last Pain:  Vitals:   05/18/18 1724  TempSrc: Oral      Patients Stated Pain Goal: 3 (58/48/35 0757)  Complications: No apparent anesthesia complications

## 2018-05-18 NOTE — Anesthesia Preprocedure Evaluation (Addendum)
Anesthesia Evaluation  Patient identified by MRN, date of birth, ID band Patient awake    Reviewed: Allergy & Precautions, NPO status , Patient's Chart, lab work & pertinent test results  History of Anesthesia Complications Negative for: history of anesthetic complications  Airway Mallampati: II  TM Distance: >3 FB Neck ROM: Full    Dental   Pulmonary neg pulmonary ROS, former smoker,    Pulmonary exam normal        Cardiovascular hypertension, Pt. on home beta blockers and Pt. on medications + CAD, + Past MI, + Cardiac Stents and +CHF  Normal cardiovascular exam     Neuro/Psych PSYCHIATRIC DISORDERS Dementia negative neurological ROS     GI/Hepatic Neg liver ROS, GERD  ,  Endo/Other  negative endocrine ROS  Renal/GU negative Renal ROS  negative genitourinary   Musculoskeletal negative musculoskeletal ROS (+)   Abdominal   Peds  Hematology negative hematology ROS (+)   Anesthesia Other Findings dementia, HTN, severe CAD/STEMI s/p recent PCI (01/31/18), ICM  TTE 01/31/18: EF 30-35% w/ severe hypokinesis of entire septal and inferior LV segment, mildly reduced RV function  LHC 01/30/18:  1. 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 Normal LVEDP 3. Successful PTCA only of the distal RCA into the PDA  Plan: DAPT for one year. Will continue IV Aggrastat for 4 hours. Given dementia I do not feel that patient is a candidate for CABG. Will need to treat disease in the LCA medically.  Reproductive/Obstetrics                            Anesthesia Physical Anesthesia Plan  ASA: IV and emergent  Anesthesia Plan: General   Post-op Pain Management:    Induction: Intravenous  PONV  Risk Score and Plan: 2 and Ondansetron, Dexamethasone and Treatment may vary due to age or medical condition  Airway Management Planned: LMA  Additional Equipment: None  Intra-op Plan:   Post-operative Plan: Extubation in OR  Informed Consent: I have reviewed the patients History and Physical, chart, labs and discussed the procedure including the risks, benefits and alternatives for the proposed anesthesia with the patient or authorized representative who has indicated his/her understanding and acceptance.     Consent reviewed with POA and Dental advisory given  Plan Discussed with:   Anesthesia Plan Comments:        Anesthesia Quick Evaluation

## 2018-05-19 DIAGNOSIS — N2889 Other specified disorders of kidney and ureter: Secondary | ICD-10-CM | POA: Diagnosis not present

## 2018-05-19 DIAGNOSIS — R310 Gross hematuria: Secondary | ICD-10-CM | POA: Diagnosis not present

## 2018-05-19 DIAGNOSIS — R339 Retention of urine, unspecified: Secondary | ICD-10-CM | POA: Diagnosis not present

## 2018-05-19 DIAGNOSIS — I2510 Atherosclerotic heart disease of native coronary artery without angina pectoris: Secondary | ICD-10-CM | POA: Diagnosis not present

## 2018-05-19 DIAGNOSIS — F0390 Unspecified dementia without behavioral disturbance: Secondary | ICD-10-CM | POA: Diagnosis not present

## 2018-05-19 DIAGNOSIS — N3289 Other specified disorders of bladder: Secondary | ICD-10-CM | POA: Diagnosis not present

## 2018-05-19 DIAGNOSIS — Z1159 Encounter for screening for other viral diseases: Secondary | ICD-10-CM | POA: Diagnosis not present

## 2018-05-19 LAB — BASIC METABOLIC PANEL
Anion gap: 8 (ref 5–15)
BUN: 33 mg/dL — ABNORMAL HIGH (ref 8–23)
CO2: 25 mmol/L (ref 22–32)
Calcium: 8.5 mg/dL — ABNORMAL LOW (ref 8.9–10.3)
Chloride: 105 mmol/L (ref 98–111)
Creatinine, Ser: 1.5 mg/dL — ABNORMAL HIGH (ref 0.61–1.24)
GFR calc Af Amer: 52 mL/min — ABNORMAL LOW (ref >60)
GFR calc non Af Amer: 45 mL/min — ABNORMAL LOW (ref >60)
Glucose, Bld: 149 mg/dL — ABNORMAL HIGH (ref 70–99)
Potassium: 3.9 mmol/L (ref 3.5–5.1)
Sodium: 138 mmol/L (ref 135–145)

## 2018-05-19 LAB — HEMOGLOBIN AND HEMATOCRIT, BLOOD
HCT: 33.8 % — ABNORMAL LOW (ref 39.0–52.0)
Hemoglobin: 11 g/dL — ABNORMAL LOW (ref 13.0–17.0)

## 2018-05-19 LAB — URINE CULTURE: Organism ID, Bacteria: NO GROWTH

## 2018-05-19 LAB — ABO/RH: ABO/RH(D): O POS

## 2018-05-19 MED ORDER — CEPHALEXIN 500 MG PO CAPS: 500.0000 mg | ORAL_CAPSULE | Freq: Three times a day (TID) | ORAL | 0 refills | Status: DC

## 2018-05-19 NOTE — Progress Notes (Signed)
Pt to be discharged to home this afternoon. Pt with history of Dementia Pt's Son [name] brought to floor wearing mask for discharge teaching and foley home care teaching. Pt's Son given teaching regarding emptying of foley bag and leg bag teaching. Pt's Son [name] voiced understanding and returned demonstration. All other discharge teaching including Medication and schedules reviewed with Pt's Son. Discharge packet with Pt and Son at time of discharge

## 2018-05-19 NOTE — Op Note (Signed)
Operative Note  Preoperative diagnosis:  1.  Gross hematuria 2.  Urinary retention 3.  Left renal mass  Postoperative diagnosis: Same  Procedure(s): 1.  Cystoscopy with clot evacuation and fulguration 2.  Bladder biopsy and fulguration  Surgeon: Allen Snuffer, MD  Assistants: None  Anesthesia: General  Complications: None immediate  EBL: 50 cc  Specimens: 1.  Bladder biopsies  Drains/Catheters: 1.  24 French three-way catheter  Intraoperative findings: 1.  Normal anterior urethra 2.  Moderately obstructing prostate. 3.  There was significant edema of the bladder.  A large amount of clot was evacuated.  No discrete mass.  However, there were prominent areas of raised mucosa that were erythematous and therefore biopsied. 4.  Bilateral ureteral orifices were in normal position.  The left ureter effluxed clear urine.  Given his blood thinner status, I decided not to perform ureteroscopy at this time.  Indication: 78 year old male presented to clinic today with gross hematuria and clot retention.  He had had a CT scan performed the day prior.  This revealed a left-sided renal mass that invaded the collecting system.  He also had a large amount of blood clot in his bladder.  He is on Brilinta and has only been off this for 1 dose.  Description of procedure:  The patient was identified and consent was obtained.  The patient was taken to the operating room and placed in the supine position.  The patient was placed under general anesthesia.  Perioperative antibiotics were administered.  The patient was placed in dorsal lithotomy.  Patient was prepped and draped in a standard sterile fashion and a timeout was performed.  A 26 French resectoscope with a visual obturator in place was advanced into the urethra and into the bladder.  Complete clot evacuation was then performed.  I then inspected the bladder mucosa.  There was erythema throughout the bladder.  This was fulgurated.  There were  some abnormal areas of bladder mucosa that were raised but no discrete mass.  I decided to take the small bladder biopsies of this.  I fulgurated the resection beds.  I collected the specimens.  Once all bleeding had stopped, I inspected bilateral ureteral orifices.  Both effluxed yellow urine.  Given his blood thinner status, I decided against ureteroscopy and decided to come back another day given there was no active bleeding noted.  I withdrew the scope and placed a 24 French urethral catheter and started continuous bladder irrigation.  Patient tolerated procedure well was stable postoperatively.  Plan: Keep overnight and wean off continuous bladder irrigation.  He will need follow-up with me in about 1 week for voiding trial and he will need to be scheduled for ureteroscopy.  He will need cardiology clearance for any significant surgeries.  His cardiologist is Dr. Martinique.

## 2018-05-19 NOTE — Discharge Summary (Signed)
Physician Discharge Summary  Patient ID: Lincoln Ginley MRN: [id number] DOB/AGE: 1940/08/21 78 y.o.  Admit date: 05/18/2018 Discharge date: 05/19/2018  Admission Diagnoses:  Discharge Diagnoses:  Active Problems:   Hematuria   Discharged Condition: good  Hospital Course: Patient underwent urgent clot evacuation, bladder biopsy, fulguration yesterday.  He tolerated the procedure well.  He was weaned off CBI.  Hemoglobin was stable.  Creatinine improved.  Therefore, he was discharged home in stable condition.  Consults: None  Significant Diagnostic Studies: labs: None  Treatments: surgery: As above  Discharge Exam: Blood pressure 105/66, pulse 62, temperature 98.3 F (36.8 C), temperature source Oral, resp. rate 18, height 5\' 8"  (1.727 m), weight 89.8 kg, SpO2 99 %. General appearance: alert and cooperative  No acute distress Adequate perfusion of extremities Nonlabored respiration, symmetrical chest rise Foley catheter draining clear yellow urine   Disposition: Discharge disposition: 01-Home or Self Care        Allergies as of 05/19/2018   No Known Allergies     Medication List    TAKE these medications   amoxicillin 875 MG tablet Commonly known as:  AMOXIL Take 1 tablet (875 mg total) by mouth 2 (two) times daily for 10 days.   aspirin 81 MG chewable tablet Chew 1 tablet (81 mg total) by mouth daily.   atorvastatin 80 MG tablet Commonly known as:  LIPITOR Take 1 tablet (80 mg total) by mouth daily at 6 PM.   cephALEXin 500 MG capsule Commonly known as:  KEFLEX Take 1 capsule (500 mg total) by mouth 3 (three) times daily.   cetirizine 10 MG tablet Commonly known as:  ZYRTEC Take 10 mg by mouth daily.   ciprofloxacin 500 MG tablet Commonly known as:  Cipro Take 1 tablet (500 mg total) by mouth 2 (two) times daily for 7 days.   donepezil 10 MG tablet Commonly known as:  ARICEPT Take 0.5 tablets (5 mg total) by mouth at bedtime.   losartan 25 MG  tablet Commonly known as:  COZAAR TAKE ONE TABLET BY MOUTH DAILY   memantine 10 MG tablet Commonly known as:  NAMENDA Take 1 tablet (10 mg total) by mouth daily.   metoprolol succinate 25 MG 24 hr tablet Commonly known as:  TOPROL-XL Take 1 tablet (25 mg total) by mouth daily.   NEXIUM PO Take 20 mg by mouth.   nitroGLYCERIN 0.4 MG SL tablet Commonly known as:  Nitrostat Place 1 tablet (0.4 mg total) under the tongue every 5 (five) minutes as needed.      He will follow-up with me next week.  He will go home with his catheter.  Signed: Marton Redwood, III 05/19/2018, 11:37 AM

## 2018-05-19 NOTE — Discharge Instructions (Signed)

## 2018-05-20 ENCOUNTER — Encounter: Payer: Self-pay | Admitting: Family Medicine

## 2018-05-20 NOTE — Anesthesia Postprocedure Evaluation (Signed)
Anesthesia Post Note  Patient: [name]  Procedure(s) Performed: CYSTOSCOPY (N/A ) TRANSURETHRAL RESECTION OF BLADDER TUMOR (TURBT), CLOT EVACUATION (N/A )     Patient location during evaluation: PACU Anesthesia Type: General Level of consciousness: awake and alert Pain management: pain level controlled Vital Signs Assessment: post-procedure vital signs reviewed and stable Respiratory status: spontaneous breathing, nonlabored ventilation, respiratory function stable and patient connected to nasal cannula oxygen Cardiovascular status: blood pressure returned to baseline and stable Postop Assessment: no apparent nausea or vomiting Anesthetic complications: no    Last Vitals:  Vitals:   05/19/18 1008 05/19/18 1233  BP: 105/66   Pulse: 62   Resp:    Temp:    SpO2:  95%    Last Pain:  Vitals:   05/19/18 0916  TempSrc:   PainSc: 0-No pain                 Lidia Collum

## 2018-05-21 ENCOUNTER — Ambulatory Visit: Payer: Medicare Other | Admitting: Cardiology

## 2018-05-21 ENCOUNTER — Encounter (HOSPITAL_COMMUNITY): Payer: Self-pay | Admitting: Urology

## 2018-05-23 ENCOUNTER — Telehealth: Payer: Self-pay | Admitting: Cardiology

## 2018-05-23 DIAGNOSIS — R310 Gross hematuria: Secondary | ICD-10-CM | POA: Diagnosis not present

## 2018-05-23 DIAGNOSIS — D4102 Neoplasm of uncertain behavior of left kidney: Secondary | ICD-10-CM | POA: Diagnosis not present

## 2018-05-23 DIAGNOSIS — R338 Other retention of urine: Secondary | ICD-10-CM | POA: Diagnosis not present

## 2018-05-23 NOTE — Telephone Encounter (Signed)
   Primary Cardiologist: Peter Martinique, MD  Chart reviewed as part of pre-operative protocol coverage. Patient was contacted 05/23/2018 in reference to pre-operative risk assessment for pending surgery as outlined below.  Konnor Vondrasek was last seen on 02/19/18 by Almyra Deforest PAC.  Since that day, Dayron Odland has done well from a cardiac perspective. 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 He has dementia, I spoke with his son (POA). Unfortunately, his PCP stopped his brilinta due to significant bladder clot and hematuria. We were contacted to comment on holding ASA for renal biopsy. I reached out to Dr. Martinique:  Per Dr. Martinique: I would not stop ASA given recent MI in January.   I will route this recommendation to the requesting party via Epic fax function and remove from pre-op pool.  Please call with questions.  Brewer, PA 05/23/2018, 3:32 PM

## 2018-05-23 NOTE — Telephone Encounter (Signed)
Dr. Martinique Looks like brilinta has been stopped for significant bladder clot/hematuria. Now requesting to hold ASA. Please comment.

## 2018-05-23 NOTE — Telephone Encounter (Signed)
New message       [name] Pre-operative Risk Assessment    Request for surgical clearance:  1. What type of surgery is being performed? Ureteroscopy and Renal Biopsy   2. When is this surgery scheduled? TBD  3. What type of clearance is required (medical clearance vs. Pharmacy clearance to hold med vs. Both)? surgical  4. Are there any medications that need to be held prior to surgery and how long?Aspirin, 5 day hold   5. Practice name and name of physician performing surgery? [facility], Dr. Link Snuffer  6. What is your office phone number [phone] ext [phone]    7.   What is your office fax number [phone]  8.   Anesthesia type (None, local, MAC, general) ?general   [name] 05/23/2018, 1:49 PM  _________________________________________________________________   (provider comments below)

## 2018-05-23 NOTE — Telephone Encounter (Signed)
I would not stop ASA given recent MI in January  [name]

## 2018-05-29 ENCOUNTER — Other Ambulatory Visit: Payer: Self-pay | Admitting: Urology

## 2018-05-29 ENCOUNTER — Ambulatory Visit: Payer: Medicare Other | Admitting: Family Medicine

## 2018-05-29 ENCOUNTER — Other Ambulatory Visit: Payer: Self-pay

## 2018-05-29 ENCOUNTER — Ambulatory Visit (INDEPENDENT_AMBULATORY_CARE_PROVIDER_SITE_OTHER): Payer: Medicare Other | Admitting: Family Medicine

## 2018-05-29 ENCOUNTER — Encounter: Payer: Self-pay | Admitting: Family Medicine

## 2018-05-29 DIAGNOSIS — N320 Bladder-neck obstruction: Secondary | ICD-10-CM

## 2018-05-29 DIAGNOSIS — N2889 Other specified disorders of kidney and ureter: Secondary | ICD-10-CM | POA: Diagnosis not present

## 2018-05-29 DIAGNOSIS — I714 Abdominal aortic aneurysm, without rupture, unspecified: Secondary | ICD-10-CM

## 2018-05-29 DIAGNOSIS — I255 Ischemic cardiomyopathy: Secondary | ICD-10-CM

## 2018-05-29 DIAGNOSIS — N1330 Unspecified hydronephrosis: Secondary | ICD-10-CM | POA: Diagnosis not present

## 2018-05-29 DIAGNOSIS — I952 Hypotension due to drugs: Secondary | ICD-10-CM | POA: Diagnosis not present

## 2018-05-29 NOTE — Progress Notes (Signed)
Subjective:  Patient ID: [name], male    DOB: 01-14-1940  Age: 78 y.o. MRN: [id number]  CC: No chief complaint on file.   HPI Delaney Schnick presents for follow-up of his gross hematuria visit from 2 weeks ago.  Since that time he has been admitted to the hospital and had a fulguration procedure to his bladder wall.  He had a CT abdomen that showed a left kidney mass.  This measured 6.7 x 4.0 x 5.5 cm in the posterior mid to lower left kidney.  It was consistent with renal cell carcinoma.  He is going to have a biopsy of that done on the eighth by Dr. Gloriann Loan, the urologist who did the fulguration and cystoscopy procedures.  Additionally on the CT a 6-1/2 cm abdominal aortic aneurysm was noted.  Bladder outlet obstruction and blood in the bladder was also noted.  There was mild bilateral hydroureteronephrosis cysts which was felt to be due to the bladder outlet obstruction.  There was a 6.0 cm abdominal aortic aneurysm in the infrarenal area.  Due to the bleeding he was taken off of Brilinta.  He will continue off of that and until definitive care of the aneurysm has been determined as well as the likely renal cell carcinoma.  His son tells me today that the patient has not had any visible hematuria in about 5 days.  He is continuing to take 81 mg of aspirin on the insistence of his cardiologist.  Over the last 2 to 3 days this son took him off of his tamsulosin because her blood pressure dropped to 90/60.  This made him very weak and dizzy.  The son stopped the tamsulosin and the pressure is back up to 116/102 today at home.  He would like to stop the Cozaar because the tamsulosin is important for the bladder outflow obstruction noted above. Depression screen PHQ 2/9 05/17/2018  Decreased Interest 0  Down, Depressed, Hopeless 0  PHQ - 2 Score 0    History Burdell has a past medical history of Dementia ([place]), Hypertension, and Myocardial infarction ([place]).   He has a past surgical history that  includes Coronary/Graft Acute MI Revascularization (N/A, 01/30/2018); LEFT HEART CATH AND CORONARY ANGIOGRAPHY (N/A, 01/30/2018); Cystoscopy with retrograde pyelogram, ureteroscopy and stent placement (N/A, 05/18/2018); and Transurethral resection of bladder tumor (N/A, 05/18/2018).   His family history includes Arthritis in his mother and son; Asthma in his son; Depression in his sister; Diabetes in his mother and son; Heart disease in his paternal grandfather; Hypertension in his mother; Obesity in his mother and son; Pulmonary embolism in his sister; Stroke in his father and mother.He reports that he quit smoking about 30 years ago. His smoking use included cigarettes. He has a 25.00 pack-year smoking history. He has never used smokeless tobacco. He reports previous alcohol use. He reports that he does not use drugs.    ROS Review of Systems  Constitutional: Negative for fever.  Respiratory: Negative for shortness of breath.   Cardiovascular: Negative for chest pain.  Musculoskeletal: Negative for arthralgias.  Skin: Negative for rash.    Objective:  There were no vitals taken for this visit.  BP Readings from Last 3 Encounters:  05/19/18 105/66  05/17/18 129/88  02/19/18 (!) 152/80    Wt Readings from Last 3 Encounters:  05/18/18 198 lb (89.8 kg)  05/17/18 198 lb 6.4 oz (90 kg)  02/19/18 199 lb 6.4 oz (90.4 kg)     Physical Exam  Exam  deferred. Pt. Harboring due to COVID 19. Phone visit performed.   Assessment & Plan:   Diagnoses and all orders for this visit:  Bladder outlet obstruction  Hydroureteronephrosis  Mass of left kidney  Abdominal aortic aneurysm, without rupture ([place]) -     Ambulatory referral to Vascular Surgery  Hypotension due to drugs       I have discontinued Pietro Cassis "[name]"'s losartan. I am also having him maintain his aspirin, atorvastatin, metoprolol succinate, nitroGLYCERIN, cetirizine, Esomeprazole Magnesium (NEXIUM PO), memantine,  donepezil, and cephALEXin.  Allergies as of 05/29/2018   No Known Allergies     Medication List       Accurate as of May 29, 2018  3:13 PM. If you have any questions, ask your nurse or doctor.        STOP taking these medications   losartan 25 MG tablet Commonly known as:  COZAAR Stopped by:  Claretta Fraise, MD     TAKE these medications   aspirin 81 MG chewable tablet Chew 1 tablet (81 mg total) by mouth daily.   atorvastatin 80 MG tablet Commonly known as:  LIPITOR Take 1 tablet (80 mg total) by mouth daily at 6 PM.   cephALEXin 500 MG capsule Commonly known as:  KEFLEX Take 1 capsule (500 mg total) by mouth 3 (three) times daily.   cetirizine 10 MG tablet Commonly known as:  ZYRTEC Take 10 mg by mouth daily.   donepezil 10 MG tablet Commonly known as:  ARICEPT Take 0.5 tablets (5 mg total) by mouth at bedtime.   memantine 10 MG tablet Commonly known as:  NAMENDA Take 1 tablet (10 mg total) by mouth daily.   metoprolol succinate 25 MG 24 hr tablet Commonly known as:  TOPROL-XL Take 1 tablet (25 mg total) by mouth daily.   NEXIUM PO Take 20 mg by mouth.   nitroGLYCERIN 0.4 MG SL tablet Commonly known as:  Nitrostat Place 1 tablet (0.4 mg total) under the tongue every 5 (five) minutes as needed.      Resume tamsulosin and DC losartan. Continue to Monitor BP  Follow-up: after release by surgery or ifBP drops again on home check  Claretta Fraise, M.D.

## 2018-05-30 DIAGNOSIS — R338 Other retention of urine: Secondary | ICD-10-CM | POA: Diagnosis not present

## 2018-06-04 ENCOUNTER — Telehealth (HOSPITAL_COMMUNITY): Payer: Self-pay | Admitting: Rehabilitation

## 2018-06-04 NOTE — Telephone Encounter (Signed)

## 2018-06-05 ENCOUNTER — Other Ambulatory Visit: Payer: Self-pay

## 2018-06-05 ENCOUNTER — Encounter: Payer: Self-pay | Admitting: Vascular Surgery

## 2018-06-05 ENCOUNTER — Ambulatory Visit (INDEPENDENT_AMBULATORY_CARE_PROVIDER_SITE_OTHER): Payer: Medicare Other | Admitting: Vascular Surgery

## 2018-06-05 VITALS — BP 124/81 | HR 68 | Temp 97.6°F | Resp 20 | Ht 68.0 in | Wt 199.0 lb

## 2018-06-05 DIAGNOSIS — I714 Abdominal aortic aneurysm, without rupture, unspecified: Secondary | ICD-10-CM

## 2018-06-05 DIAGNOSIS — I255 Ischemic cardiomyopathy: Secondary | ICD-10-CM

## 2018-06-05 NOTE — Progress Notes (Signed)
[facility]  Patient name: [name] MRN: [id number] DOB: 01-15-1940 Sex: male  REASON FOR CONSULT: Discuss recent diagnosis of infrarenal abdominal aortic aneurysm  HPI: [name] is a 78 y.o. male, who is here today for discussion of his recent CT diagnosis of infrarenal abdominal aortic aneurysm.  I am also discussing this with his son by telephone due to [facility] visitor restrictions.  He presented to the emergency department with gross hematuria and CT scan revealed probable left lower kidney cancer.  Saw had incidental finding of a 6 cm infrarenal abdominal aortic aneurysm.  History of aneurysm and is not symptomatic from this.  He has undergone cystoscopy and ureteral stent placement and is scheduled for repeat studies and potential left nephrectomy.  He does have history of coronary disease.  Myocardial infarction in the past and mild dementia.  Past Medical History:  Diagnosis Date  . Dementia ([place])   . Hypertension   . Myocardial infarction [facility])     Family History  Problem Relation Age of Onset  . Arthritis Mother   . Hypertension Mother   . Obesity Mother   . Stroke Mother   . Diabetes Mother   . Stroke Father   . Pulmonary embolism Sister   . Arthritis Son   . Asthma Son   . Diabetes Son   . Obesity Son   . Heart disease Paternal Grandfather   . Depression Sister     SOCIAL HISTORY: Social History   Socioeconomic History  . Marital status: Divorced    Spouse name: Not on file  . Number of children: Not on file  . Years of education: Not on file  . Highest education level: Not on file  Occupational History  . Not on file  Social Needs  . Financial resource strain: Not on file  . Food insecurity:    Worry: Not on file    Inability: Not on file  . Transportation needs:    Medical: Not on file    Non-medical: Not on file  Tobacco Use  . Smoking status: Former Smoker    Packs/day:  1.00    Years: 25.00    Pack years: 25.00    Types: Cigarettes    Last attempt to quit: 02/13/1988    Years since quitting: 30.3  . Smokeless tobacco: Never Used  Substance and Sexual Activity  . Alcohol use: Not Currently  . Drug use: Never  . Sexual activity: Not Currently  Lifestyle  . Physical activity:    Days per week: Not on file    Minutes per session: Not on file  . Stress: Not on file  Relationships  . Social connections:    Talks on phone: Not on file    Gets together: Not on file    Attends religious service: Not on file    Active member of club or organization: Not on file    Attends meetings of clubs or organizations: Not on file    Relationship status: Not on file  . Intimate partner violence:    Fear of current or ex partner: Not on file    Emotionally abused: Not on file    Physically abused: Not on file    Forced sexual activity: Not on file  Other Topics Concern  . Not on file  Social History Narrative  . Not on file    No Known Allergies  Current Outpatient Medications  Medication Sig Dispense Refill  . alfuzosin (  UROXATRAL) 10 MG 24 hr tablet Take 10 mg by mouth at bedtime.    Marland Kitchen aspirin 81 MG chewable tablet Chew 1 tablet (81 mg total) by mouth daily. 90 tablet 3  . atorvastatin (LIPITOR) 80 MG tablet Take 1 tablet (80 mg total) by mouth daily at 6 PM. 30 tablet 6  . cetirizine (ZYRTEC) 10 MG tablet Take 10 mg by mouth daily.    Marland Kitchen donepezil (ARICEPT) 10 MG tablet Take 0.5 tablets (5 mg total) by mouth at bedtime. 90 tablet 1  . Esomeprazole Magnesium (NEXIUM PO) Take 20 mg by mouth.    . memantine (NAMENDA) 10 MG tablet Take 1 tablet (10 mg total) by mouth daily. 90 tablet 1  . metoprolol succinate (TOPROL-XL) 25 MG 24 hr tablet Take 1 tablet (25 mg total) by mouth daily. 25 tablet 6  . tamsulosin (FLOMAX) 0.4 MG CAPS capsule Take 0.4 mg by mouth daily.    . nitroGLYCERIN (NITROSTAT) 0.4 MG SL tablet Place 1 tablet (0.4 mg total) under the tongue  every 5 (five) minutes as needed. (Patient not taking: Reported on 06/05/2018) 25 tablet 12   No current facility-administered medications for this visit.     REVIEW OF SYSTEMS:  [X]  denotes positive finding, [ ]  denotes negative finding Cardiac  Comments:  Chest pain or chest pressure:    Shortness of breath upon exertion:    Short of breath when lying flat:    Irregular heart rhythm:        Vascular    Pain in calf, thigh, or hip brought on by ambulation:    Pain in feet at night that wakes you up from your sleep:     Blood clot in your veins:    Leg swelling:         Pulmonary    Oxygen at home:    Productive cough:     Wheezing:         Neurologic    Sudden weakness in arms or legs:     Sudden numbness in arms or legs:     Sudden onset of difficulty speaking or slurred speech:    Temporary loss of vision in one eye:     Problems with dizziness:         Gastrointestinal    Blood in stool:     Vomited blood:         Genitourinary    Burning when urinating:  x   Blood in urine: x       Psychiatric    Major depression:         Hematologic    Bleeding problems:    Problems with blood clotting too easily:        Skin    Rashes or ulcers:        Constitutional    Fever or chills:      PHYSICAL EXAM: Vitals:   06/05/18 1435  BP: 124/81  Pulse: 68  Resp: 20  Temp: 97.6 F (36.4 C)  SpO2: 96%  Weight: 199 lb (90.3 kg)  Height: 5\' 8"  (1.727 m)    GENERAL: The patient is a well-nourished male, in no acute distress. The vital signs are documented above. CARDIOVASCULAR: 2+ radial pulses bilaterally.  2+ popliteal pulses without evidence of popliteal artery aneurysm.  2+ dorsalis pedis pulses. PULMONARY: There is good air exchange  ABDOMEN: Soft and non-tender.  I do not palpate an aneurysm MUSCULOSKELETAL: There are no major deformities or cyanosis. NEUROLOGIC:  No focal weakness or paresthesias are detected. SKIN: There are no ulcers or rashes noted.  PSYCHIATRIC: The patient has a normal affect.  DATA:  Scan was reviewed and discussed with the patient and his son.  He does have a infrarenal abdominal otic aneurysm.  He does appear to have adequate anatomy for stent graft repair.   Medical issues:   I did explain that the typical recommendation for repair of aneurysm would be size 5.5 cm which he is exceeded.  Explained that this would certainly be placed on the" back burner" until issues regarding his kidney cancer are resolved.  I explained that he has approximately 5 to 10% risk of annual rupture with his 6 cm aneurysm.  We have scheduled an appointment to see him back for continued discussion in 3 months.  If he has recovered from his kidney cancer treatment and has no evidence of advanced disease, would consider stent graft repair.     Rosetta Posner, MD FACS [facility] of [facility] Tel [phone] Pager [phone]

## 2018-06-06 NOTE — Patient Instructions (Addendum)
[name]  06/06/2018   Your procedure is scheduled on:  Monday 06/11/2018   Report to [facility] Main  Entrance              Report to Admitting at  1:30 PM               YOU NEED TO HAVE A COVID 19 TEST ON 06-07-18 @ 3:00 PM_______, THIS TEST MUST BE DONE BEFORE SURGERY, COME TO [place]. ONCE YOU ARE TESTED, PLEASE BEGIN YOUR QUARANTINE INSTRUCTIONS PER YOUR HANDOUT   Call this number if you have problems the morning of surgery [phone]    Remember: Do not eat food  :After Midnight. May have clear liquids from midnight up until 0930 am then nothing until after surgery.    CLEAR LIQUID DIET   Foods Allowed                                                                     Foods Excluded  Coffee and tea, regular and decaf                             liquids that you cannot  Plain Jell-O in any flavor                                             see through such as: Fruit ices (not with fruit pulp)                                     milk, soups, orange juice  Iced Popsicles                                    All solid food Carbonated beverages, regular and diet                                    Cranberry, grape and apple juices Sports drinks like Gatorade Lightly seasoned clear broth or consume(fat free) Sugar, honey syrup  Sample Menu Breakfast                                Lunch                                     Supper Cranberry juice                    Beef broth                            Chicken broth Jell-O  Grape juice                           Apple juice Coffee or tea                        Jell-O                                      Popsicle                                                Coffee or tea                        Coffee or tea  _____________________________________________________________________    BRUSH YOUR TEETH MORNING OF SURGERY AND RINSE  YOUR MOUTH OUT, NO CHEWING GUM CANDY OR MINTS.     Take these medicines the morning of surgery with A SIP OF WATER: Memantine (Namenda), Metoprolol succinate (Toprol-XL), Esomeprazole (Nexium), Cetirizine (Zyrtec)                                You may not have any metal on your body including hair pins and              piercings     Do not wear jewelry, cologne, lotions, powders or deodorant                         Men may shave face and neck.   Do not bring valuables to the hospital. [place].  Contacts, dentures or bridgework may not be worn into surgery.       Patients discharged the day of surgery will not be allowed to drive home. IF YOU ARE HAVING SURGERY AND GOING HOME THE SAME DAY, YOU MUST HAVE AN ADULT TO DRIVE YOU HOME AND BE WITH YOU FOR 24 HOURS. YOU MAY GO HOME BY TAXI OR UBER OR ORTHERWISE, BUT AN ADULT MUST ACCOMPANY YOU HOME AND STAY WITH YOU FOR 24 HOURS.    Name and phone number of your driver: Garin Mata [phone]               Please read over the following fact sheets you were given: _____________________________________________________________________             [facility] - Preparing for Surgery Before surgery, you can play an important role.  Because skin is not sterile, your skin needs to be as free of germs as possible.  You can reduce the number of germs on your skin by washing with CHG (chlorahexidine gluconate) soap before surgery.  CHG is an antiseptic cleaner which kills germs and bonds with the skin to continue killing germs even after washing. Please DO NOT use if you have an allergy to CHG or antibacterial soaps.  If your skin becomes reddened/irritated stop using the CHG and inform your nurse when you arrive at Short Stay. Do not shave (including legs and underarms) for at least 48 hours prior  to the first CHG shower.  You may shave your face/neck. Please follow these instructions  carefully:  1.  Shower with CHG Soap the night before surgery and the  morning of Surgery.  2.  If you choose to wash your hair, wash your hair first as usual with your  normal  shampoo.  3.  After you shampoo, rinse your hair and body thoroughly to remove the  shampoo.                           4.  Use CHG as you would any other liquid soap.  You can apply chg directly  to the skin and wash                       Gently with a scrungie or clean washcloth.  5.  Apply the CHG Soap to your body ONLY FROM THE NECK DOWN.   Do not use on face/ open                           Wound or open sores. Avoid contact with eyes, ears mouth and genitals (private parts).                       Wash face,  Genitals (private parts) with your normal soap.             6.  Wash thoroughly, paying special attention to the area where your surgery  will be performed.  7.  Thoroughly rinse your body with warm water from the neck down.  8.  DO NOT shower/wash with your normal soap after using and rinsing off  the CHG Soap.                9.  Pat yourself dry with a clean towel.            10.  Wear clean pajamas.            11.  Place clean sheets on your bed the night of your first shower and do not  sleep with pets. Day of Surgery : Do not apply any lotions/deodorants the morning of surgery.  Please wear clean clothes to the hospital/surgery center.  FAILURE TO FOLLOW THESE INSTRUCTIONS MAY RESULT IN THE CANCELLATION OF YOUR SURGERY PATIENT SIGNATURE_________________________________  NURSE SIGNATURE__________________________________  ________________________________________________________________________

## 2018-06-06 NOTE — Progress Notes (Addendum)
06/05/2018- noted in Epic-office visit with Dr. Curt Jews- Vascular surgery (for consultation of AAA 6 cm)  05/23/2018- noted in [place] from [facility] for Cardiology  05/17/2018- noted in [place] abdomen pelvis w/wo contrast  02/19/2018- noted in [place]  01/31/2018- noted in Epic-ECHO  01/30/2018- noted in Epic-Coronary /Graft Acute MI Revascularization, Left Heart Cath and Coronary Angiography

## 2018-06-07 ENCOUNTER — Encounter (HOSPITAL_COMMUNITY)
Admission: RE | Admit: 2018-06-07 | Discharge: 2018-06-07 | Disposition: A | Discharge status: Home / Self Care | Payer: Medicare Other | Source: Ambulatory Visit | Attending: Urology | Admitting: Urology

## 2018-06-07 ENCOUNTER — Other Ambulatory Visit: Payer: Self-pay

## 2018-06-07 ENCOUNTER — Other Ambulatory Visit (HOSPITAL_COMMUNITY)
Admission: RE | Admit: 2018-06-07 | Discharge: 2018-06-07 | Disposition: A | Discharge status: Home / Self Care | Payer: Medicare Other | Source: Ambulatory Visit | Attending: Urology | Admitting: Urology

## 2018-06-07 ENCOUNTER — Encounter (HOSPITAL_COMMUNITY): Payer: Self-pay

## 2018-06-07 DIAGNOSIS — Z01812 Encounter for preprocedural laboratory examination: Secondary | ICD-10-CM | POA: Diagnosis not present

## 2018-06-07 DIAGNOSIS — Z1159 Encounter for screening for other viral diseases: Secondary | ICD-10-CM | POA: Diagnosis not present

## 2018-06-07 LAB — CBC
HCT: 36.5 % — ABNORMAL LOW (ref 39.0–52.0)
Hemoglobin: 11.6 g/dL — ABNORMAL LOW (ref 13.0–17.0)
MCH: 29.3 pg (ref 26.0–34.0)
MCHC: 31.8 g/dL (ref 30.0–36.0)
MCV: 92.2 fL (ref 80.0–100.0)
Platelets: 214 10*3/uL (ref 150–400)
RBC: 3.96 MIL/uL — ABNORMAL LOW (ref 4.22–5.81)
RDW: 13.7 % (ref 11.5–15.5)
WBC: 5.7 10*3/uL (ref 4.0–10.5)
nRBC: 0 % (ref 0.0–0.2)

## 2018-06-07 LAB — BASIC METABOLIC PANEL
Anion gap: 7 (ref 5–15)
BUN: 16 mg/dL (ref 8–23)
CO2: 26 mmol/L (ref 22–32)
Calcium: 8.8 mg/dL — ABNORMAL LOW (ref 8.9–10.3)
Chloride: 107 mmol/L (ref 98–111)
Creatinine, Ser: 0.84 mg/dL (ref 0.61–1.24)
GFR calc Af Amer: >60 mL/min (ref >60)
GFR calc non Af Amer: >60 mL/min (ref >60)
Glucose, Bld: 97 mg/dL (ref 70–99)
Potassium: 4 mmol/L (ref 3.5–5.1)
Sodium: 140 mmol/L (ref 135–145)

## 2018-06-07 NOTE — Progress Notes (Signed)
Consent and written instructions discussed with son, Cadel Stairs, via speaker phone. Son verbalized understanding.

## 2018-06-08 LAB — NOVEL CORONAVIRUS, NAA (HOSP ORDER, SEND-OUT TO REF LAB; TAT 18-24 HRS): SARS-CoV-2, NAA: NOT DETECTED

## 2018-06-08 NOTE — Progress Notes (Signed)
Anesthesia Chart Review   Case:  [id number] Date/Time:  06/11/18 1515   Procedure:  CYSTOSCOPY WITH LEFT  URETEROSCOPY BIOPSY AND STENT PLACEMENT (Left )   Anesthesia type:  General   Pre-op diagnosis:  LEFT RENAL MASS   Location:  [place] / [facility]   Surgeon:  Lucas Mallow, MD      DISCUSSION: 78 yo former smoker (25 pack years, quit 02/13/88) with h/o HTN, STEMI 01/2018 (100% distal RCA occlusion treated with PTCA, not a candidate for CABG), ischemic cardiomyopathy (EF 30-35% 01/31/2018), 6cm AAA followed by Dr. Donnetta Hutching, dementia, left renal mass scheduled for above procedure 06/11/18 with Dr. Genevie Cheshire.   Pt seen by vascular surgeon, Dr. Curt Jews, for evaluation of incidental finding of 6 cm infrarenal abdominal aortic aneurysm on CT 05/17/2018.  Per his note, "I did explain that the typical recommendation for repair of aneurysm would be size 5.5 cm which he is exceeded.  Explained that this would certainly be placed on the" back burner" until issues regarding his kidney cancer are resolved.  I explained that he has approximately 5 to 10% risk of annual rupture with his 6 cm aneurysm.  We have scheduled an appointment to see him back for continued discussion in 3 months.  If he has recovered from his kidney cancer treatment and has no evidence of advanced disease, would consider stent graft repair."  Pt cleared by cardiology 05/23/2018.  Per Fabian Sharp, PA-C, " Dominyck Reser was last seen on 02/19/18 by Almyra Deforest PAC.  Since that day, Bergen Magner has done well from a cardiac perspective. 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 He has dementia, I spoke with his son (POA). Unfortunately, his PCP stopped his brilinta due to significant bladder clot and hematuria. We were contacted to comment on holding ASA for renal biopsy. I reached out to Dr. Martinique: Per Dr. Martinique: I would not stop ASA given recent MI in January."  Anticipate pt can proceed with planned procedure barring acute status change.   VS: BP 118/89 (BP Location: Left Arm)   Pulse 62   Temp 36.6 C (Oral)   Resp 18   Ht 5\' 8"  (1.727 m)   Wt 90 kg   SpO2 98%   BMI 30.16 kg/m   PROVIDERS: Claretta Fraise, MD is PCP   Curt Jews, is Vascular Surgeon  Martinique, Peter, MD is Cardiologist  LABS: Labs reviewed: Acceptable for surgery. (all labs ordered are listed, but only abnormal results are displayed)  Labs Reviewed  BASIC METABOLIC PANEL - Abnormal; Notable for the following components:      Result Value   Calcium 8.8 (*)    All other components within normal limits  CBC - Abnormal; Notable for the following components:   RBC 3.96 (*)    Hemoglobin 11.6 (*)    HCT 36.5 (*)    All other components within normal limits     IMAGES: CT Abdomen Pelvis 05/17/2018 IMPRESSION: 1. Avidly enhancing 6.7 x 4.0 x 5.5 cm renal cortical mass in the posterior mid to lower left kidney, compatible with renal cell carcinoma. This mass demonstrates direct invasion of the lower left renal collecting system and of the perinephric fat. No evidence of renal vein tumor thrombus. 2. No findings of metastatic disease in the abdomen or pelvis. 3. Prominent nonenhancing hyperdense material layering in the bladder compatible with blood products. Prominent bladder distention, suggesting bladder outlet obstruction. 4. Mild bilateral hydroureteronephrosis probably due to bladder  outlet obstruction. 5. Mild prostatomegaly. 6. Infrarenal 6.0 cm Abdominal Aortic Aneurysm (ICD10-I71.9). Vascular surgery referral/consultation is recommended due to increased risk for rupture. This recommendation follows ACR consensus guidelines: White Paper of the ACR Incidental Findings Committee II on Vascular Findings. J Am Coll Radiol 2013; 10:789-794. 7.  Aortic Atherosclerosis (ICD10-I70.0).  EKG: 02/19/2018 Rate 53 bpm Sinus bradycardia Inferior infarct, age undetermined  CV: Echo 01/31/2018 IMPRESSIONS    1. The left ventricle  appears to be normal in size, has moderate wall thickness 30-35% ejection fraction Spectral Doppler shows impaired relaxation pattern of diastolic filling.  2. There is severe hypokinesis of the entire septal and inferior left ventricular segments.  3. The right ventricle has normal size and mildly reduced systolic function.  4. Right ventricular systolic pressure is could not be assessed.  5. Normal left atrial size.  6. Normal right atrial size.  7. Mitral valve regurgitation is trivial by color flow Doppler.  8. No atrial level shunt detected by color flow Doppler.  Cardiac Cath 01/30/2018  Dist RCA-1 lesion is 50% stenosed.  Dist RCA-2 lesion is 100% stenosed.  Dist LM to Ost LAD lesion is 80% stenosed.  Prox LAD lesion is 75% stenosed.  Prox LAD to Mid LAD lesion is 80% stenosed.  1st Diag lesion is 60% stenosed.  Dist Cx lesion is 50% stenosed.  Balloon angioplasty was performed using a BALLOON SAPPHIRE 2.0X15.  Post intervention, there is a 0% residual stenosis.  Post intervention, there is a 25% residual stenosis.  Balloon angioplasty was performed using a BALLOON SAPPHIRE 2.5X15.  LV end diastolic pressure is normal.   1. 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 Normal LVEDP 3. Successful PTCA only of the distal RCA into the PDA   Plan: DAPT for one year. Will continue IV Aggrastat for 4 hours. Given dementia I do not feel that patient is a candidate for CABG. Will need to treat disease in the LCA medically. Past Medical History:  Diagnosis Date  . Dementia ([place])   . Hypertension   . Myocardial infarction [facility])     Past Surgical History:  Procedure Laterality Date  . CORONARY/GRAFT ACUTE MI REVASCULARIZATION N/A 01/30/2018   Procedure:  Coronary/Graft Acute MI Revascularization;  Surgeon: Martinique, Peter M, MD;  Location: [place] CV LAB;  Service: Cardiovascular;  Laterality: N/A;  . CYSTOSCOPY WITH RETROGRADE PYELOGRAM, URETEROSCOPY AND STENT PLACEMENT N/A 05/18/2018   Procedure: CYSTOSCOPY;  Surgeon: Lucas Mallow, MD;  Location: [facility];  Service: Urology;  Laterality: N/A;  . LEFT HEART CATH AND CORONARY ANGIOGRAPHY N/A 01/30/2018   Procedure: LEFT HEART CATH AND CORONARY ANGIOGRAPHY;  Surgeon: Martinique, Peter M, MD;  Location: [place] CV LAB;  Service: Cardiovascular;  Laterality: N/A;  . TRANSURETHRAL RESECTION OF BLADDER TUMOR N/A 05/18/2018   Procedure: TRANSURETHRAL RESECTION OF BLADDER TUMOR (TURBT), CLOT EVACUATION;  Surgeon: Lucas Mallow, MD;  Location: [facility];  Service: Urology;  Laterality: N/A;    MEDICATIONS: . alfuzosin (UROXATRAL) 10 MG 24 hr tablet  . aspirin 81 MG chewable tablet  . atorvastatin (LIPITOR) 80 MG tablet  . cetirizine (ZYRTEC) 10 MG tablet  . donepezil (ARICEPT) 10 MG tablet  . esomeprazole ([place])  20 MG capsule  . memantine (NAMENDA) 10 MG tablet  . metoprolol succinate (TOPROL-XL) 25 MG 24 hr tablet  . nitroGLYCERIN (NITROSTAT) 0.4 MG SL tablet   No current facility-administered medications for this encounter.     Maia Plan [facility] Pre-Surgical Testing [phone] 06/08/18 9:55 AM

## 2018-06-08 NOTE — Progress Notes (Signed)
SPOKE W/  _Pt's son [name], states Pt has alzheimers     SCREENING SYMPTOMS OF COVID 19:   COUGH--no  RUNNY NOSE---nothing new   SORE THROAT---no  NASAL CONGESTION----nothing new  SNEEZING----no  SHORTNESS OF BREATH---no  DIFFICULTY BREATHING---no  TEMP >100.0 -----no  UNEXPLAINED BODY ACHES------no  CHILLS ----no----   HEADACHES ------no---  LOSS OF SMELL/ TASTE ------no--    HAVE YOU OR ANY FAMILY MEMBER TRAVELLED PAST 14 DAYS OUT OF THE   COUNTY---no  STATE---no- COUNTRY----no  HAVE YOU OR ANY FAMILY MEMBER BEEN EXPOSED TO ANYONE WITH COVID 19? denies

## 2018-06-08 NOTE — Anesthesia Preprocedure Evaluation (Addendum)
Anesthesia Evaluation  Patient identified by MRN, date of birth, ID band Patient awake    Reviewed: Allergy & Precautions, NPO status , Patient's Chart, lab work & pertinent test results, reviewed documented beta blocker date and time   History of Anesthesia Complications Negative for: history of anesthetic complications  Airway Mallampati: II  TM Distance: >3 FB Neck ROM: Full    Dental no notable dental hx.    Pulmonary former smoker,    Pulmonary exam normal        Cardiovascular hypertension, Pt. on medications and Pt. on home beta blockers + CAD, + Past MI, + Cardiac Stents and +CHF  Normal cardiovascular exam  TTE 01/31/18: EF 30-35% w/ severe hypokinesis of entire septal and inferior LV segment, mildly reduced RV function  LHC 01/30/18:  1. 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 Normal LVEDP 3. Successful PTCA only of the distal RCA into the PDA  Plan: DAPT for one year. Will continue IV Aggrastat for 4 hours. Given dementia I do not feel that patient is a candidate for CABG. Will need to treat disease in the LCA medically.   Neuro/Psych Dementia negative neurological ROS     GI/Hepatic Neg liver ROS, GERD  Controlled and Medicated,  Endo/Other  negative endocrine ROS  Renal/GU negative Renal ROS   Hematuria    Musculoskeletal negative musculoskeletal ROS (+)   Abdominal   Peds  Hematology  (+) anemia , Hgb 11.6   Anesthesia Other Findings Day of surgery medications reviewed with the patient.  Reproductive/Obstetrics                           Anesthesia Physical Anesthesia Plan  ASA: IV  Anesthesia Plan: General   Post-op Pain Management:    Induction:  Intravenous  PONV Risk Score and Plan: 3 and Treatment may vary due to age or medical condition and Ondansetron  Airway Management Planned: LMA  Additional Equipment:   Intra-op Plan:   Post-operative Plan: Extubation in OR  Informed Consent: I have reviewed the patients History and Physical, chart, labs and discussed the procedure including the risks, benefits and alternatives for the proposed anesthesia with the patient or authorized representative who has indicated his/her understanding and acceptance.     Dental advisory given  Plan Discussed with: CRNA  Anesthesia Plan Comments: (See PAT note 06/07/2018, Konrad Felix, PA-C)      Anesthesia Quick Evaluation

## 2018-06-11 ENCOUNTER — Encounter (HOSPITAL_COMMUNITY): Payer: Self-pay | Admitting: Emergency Medicine

## 2018-06-11 ENCOUNTER — Other Ambulatory Visit: Payer: Self-pay

## 2018-06-11 ENCOUNTER — Ambulatory Visit (HOSPITAL_COMMUNITY): Payer: Medicare Other | Admitting: Physician Assistant

## 2018-06-11 ENCOUNTER — Ambulatory Visit (HOSPITAL_COMMUNITY): Payer: Medicare Other

## 2018-06-11 ENCOUNTER — Encounter (HOSPITAL_COMMUNITY)
Admission: RE | Disposition: A | Discharge status: Home / Self Care | Payer: Self-pay | Source: Home / Self Care | Attending: Urology

## 2018-06-11 ENCOUNTER — Ambulatory Visit (HOSPITAL_COMMUNITY)
Admission: RE | Admit: 2018-06-11 | Discharge: 2018-06-11 | Disposition: A | Discharge status: Home / Self Care | Payer: Medicare Other | Attending: Urology | Admitting: Urology

## 2018-06-11 ENCOUNTER — Ambulatory Visit (HOSPITAL_COMMUNITY): Payer: Medicare Other | Admitting: Anesthesiology

## 2018-06-11 DIAGNOSIS — Z87891 Personal history of nicotine dependence: Secondary | ICD-10-CM | POA: Insufficient documentation

## 2018-06-11 DIAGNOSIS — Z79899 Other long term (current) drug therapy: Secondary | ICD-10-CM | POA: Insufficient documentation

## 2018-06-11 DIAGNOSIS — I110 Hypertensive heart disease with heart failure: Secondary | ICD-10-CM | POA: Diagnosis not present

## 2018-06-11 DIAGNOSIS — F0390 Unspecified dementia without behavioral disturbance: Secondary | ICD-10-CM | POA: Diagnosis not present

## 2018-06-11 DIAGNOSIS — R339 Retention of urine, unspecified: Secondary | ICD-10-CM | POA: Diagnosis not present

## 2018-06-11 DIAGNOSIS — Z7902 Long term (current) use of antithrombotics/antiplatelets: Secondary | ICD-10-CM | POA: Insufficient documentation

## 2018-06-11 DIAGNOSIS — Z955 Presence of coronary angioplasty implant and graft: Secondary | ICD-10-CM | POA: Insufficient documentation

## 2018-06-11 DIAGNOSIS — I2510 Atherosclerotic heart disease of native coronary artery without angina pectoris: Secondary | ICD-10-CM | POA: Diagnosis not present

## 2018-06-11 DIAGNOSIS — Z7982 Long term (current) use of aspirin: Secondary | ICD-10-CM | POA: Diagnosis not present

## 2018-06-11 DIAGNOSIS — I252 Old myocardial infarction: Secondary | ICD-10-CM | POA: Diagnosis not present

## 2018-06-11 DIAGNOSIS — I213 ST elevation (STEMI) myocardial infarction of unspecified site: Secondary | ICD-10-CM | POA: Diagnosis not present

## 2018-06-11 DIAGNOSIS — N2889 Other specified disorders of kidney and ureter: Secondary | ICD-10-CM | POA: Diagnosis not present

## 2018-06-11 DIAGNOSIS — I509 Heart failure, unspecified: Secondary | ICD-10-CM | POA: Insufficient documentation

## 2018-06-11 DIAGNOSIS — K219 Gastro-esophageal reflux disease without esophagitis: Secondary | ICD-10-CM | POA: Diagnosis not present

## 2018-06-11 DIAGNOSIS — R310 Gross hematuria: Secondary | ICD-10-CM | POA: Diagnosis present

## 2018-06-11 DIAGNOSIS — I10 Essential (primary) hypertension: Secondary | ICD-10-CM | POA: Diagnosis not present

## 2018-06-11 HISTORY — PX: CYSTOSCOPY WITH URETEROSCOPY AND STENT PLACEMENT: SHX6377

## 2018-06-11 SURGERY — CYSTOURETEROSCOPY, WITH STENT INSERTION
Anesthesia: General | Laterality: Left

## 2018-06-11 MED ORDER — PROPOFOL 10 MG/ML IV BOLUS
INTRAVENOUS | Status: AC
  Filled 2018-06-11: qty 20

## 2018-06-11 MED ORDER — PROMETHAZINE HCL 25 MG/ML IJ SOLN: 6.2500 mg | INTRAMUSCULAR | Status: DC | PRN

## 2018-06-11 MED ORDER — SODIUM CHLORIDE 0.9 % IV SOLN
INTRAVENOUS | Status: DC | PRN
  Administered 2018-06-11: 13:00:00 25 ug/min via INTRAVENOUS

## 2018-06-11 MED ORDER — HYDROCODONE-ACETAMINOPHEN 5-325 MG PO TABS: 1.0000 | ORAL_TABLET | ORAL | 0 refills | Status: DC | PRN

## 2018-06-11 MED ORDER — LIDOCAINE 2% (20 MG/ML) 5 ML SYRINGE
INTRAMUSCULAR | Status: AC
  Filled 2018-06-11: qty 5

## 2018-06-11 MED ORDER — CEFAZOLIN SODIUM-DEXTROSE 2-4 GM/100ML-% IV SOLN
2.0000 g | INTRAVENOUS | Status: AC
  Administered 2018-06-11: 13:00:00 2 g via INTRAVENOUS
  Filled 2018-06-11: qty 100

## 2018-06-11 MED ORDER — PHENYLEPHRINE 40 MCG/ML (10ML) SYRINGE FOR IV PUSH (FOR BLOOD PRESSURE SUPPORT)
PREFILLED_SYRINGE | INTRAVENOUS | Status: AC
  Filled 2018-06-11: qty 10

## 2018-06-11 MED ORDER — LACTATED RINGERS IV SOLN
INTRAVENOUS | Status: DC
  Administered 2018-06-11: 13:00:00 via INTRAVENOUS

## 2018-06-11 MED ORDER — LIDOCAINE HCL (CARDIAC) PF 100 MG/5ML IV SOSY
PREFILLED_SYRINGE | INTRAVENOUS | Status: DC | PRN
  Administered 2018-06-11: 100 mg via INTRAVENOUS

## 2018-06-11 MED ORDER — OXYCODONE HCL 5 MG/5ML PO SOLN: 5.0000 mg | Freq: Once | ORAL | Status: DC | PRN

## 2018-06-11 MED ORDER — 0.9 % SODIUM CHLORIDE (POUR BTL) OPTIME
TOPICAL | Status: DC | PRN
  Administered 2018-06-11: 1000 mL

## 2018-06-11 MED ORDER — ACETAMINOPHEN 10 MG/ML IV SOLN: 1000.0000 mg | Freq: Once | INTRAVENOUS | Status: DC | PRN

## 2018-06-11 MED ORDER — FENTANYL CITRATE (PF) 100 MCG/2ML IJ SOLN
INTRAMUSCULAR | Status: AC
  Filled 2018-06-11: qty 2

## 2018-06-11 MED ORDER — FENTANYL CITRATE (PF) 100 MCG/2ML IJ SOLN
INTRAMUSCULAR | Status: DC | PRN
  Administered 2018-06-11: 50 ug via INTRAVENOUS

## 2018-06-11 MED ORDER — SODIUM CHLORIDE 0.9 % IR SOLN
Status: DC | PRN
  Administered 2018-06-11: 3000 mL

## 2018-06-11 MED ORDER — OXYCODONE HCL 5 MG PO TABS: 5.0000 mg | ORAL_TABLET | Freq: Once | ORAL | Status: DC | PRN

## 2018-06-11 MED ORDER — ONDANSETRON HCL 4 MG/2ML IJ SOLN
INTRAMUSCULAR | Status: DC | PRN
  Administered 2018-06-11: 4 mg via INTRAVENOUS

## 2018-06-11 MED ORDER — DEXAMETHASONE SODIUM PHOSPHATE 10 MG/ML IJ SOLN
INTRAMUSCULAR | Status: AC
  Filled 2018-06-11: qty 1

## 2018-06-11 MED ORDER — IOHEXOL 300 MG/ML  SOLN
INTRAMUSCULAR | Status: DC | PRN
  Administered 2018-06-11: 10 mL

## 2018-06-11 MED ORDER — PROPOFOL 10 MG/ML IV BOLUS
INTRAVENOUS | Status: DC | PRN
  Administered 2018-06-11: 100 mg via INTRAVENOUS

## 2018-06-11 MED ORDER — PHENYLEPHRINE HCL (PRESSORS) 10 MG/ML IV SOLN
INTRAVENOUS | Status: DC | PRN
  Administered 2018-06-11 (×2): 80 ug via INTRAVENOUS

## 2018-06-11 MED ORDER — FENTANYL CITRATE (PF) 100 MCG/2ML IJ SOLN: 25.0000 ug | INTRAMUSCULAR | Status: DC | PRN

## 2018-06-11 MED ORDER — ONDANSETRON HCL 4 MG/2ML IJ SOLN
INTRAMUSCULAR | Status: AC
  Filled 2018-06-11: qty 2

## 2018-06-11 MED ORDER — PHENYLEPHRINE HCL (PRESSORS) 10 MG/ML IV SOLN
INTRAVENOUS | Status: AC
  Filled 2018-06-11: qty 1

## 2018-06-11 SURGICAL SUPPLY — 23 items
BAG URO CATCHER STRL LF (MISCELLANEOUS) ×3 IMPLANT
BASKET LASER NITINOL 1.9FR (BASKET) IMPLANT
BASKET ZERO TIP NITINOL 2.4FR (BASKET) IMPLANT
CATH INTERMIT  6FR 70CM (CATHETERS) IMPLANT
CATH URET 5FR 28IN CONE TIP (BALLOONS)
CATH URET 5FR 70CM CONE TIP (BALLOONS) IMPLANT
CATH URET DUAL LUMEN 6-10FR 50 (CATHETERS) ×3 IMPLANT
CLOTH BEACON ORANGE TIMEOUT ST (SAFETY) ×3 IMPLANT
COVER WAND RF STERILE (DRAPES) IMPLANT
EXTRACTOR STONE 1.7FRX115CM (UROLOGICAL SUPPLIES) IMPLANT
FIBER LASER FLEXIVA 365 (UROLOGICAL SUPPLIES) IMPLANT
FIBER LASER TRAC TIP (UROLOGICAL SUPPLIES) IMPLANT
GLOVE BIO SURGEON STRL SZ7.5 (GLOVE) ×3 IMPLANT
GOWN STRL REUS W/TWL XL LVL3 (GOWN DISPOSABLE) ×3 IMPLANT
GUIDEWIRE ANG ZIPWIRE 038X150 (WIRE) IMPLANT
GUIDEWIRE STR DUAL SENSOR (WIRE) ×6 IMPLANT
KIT TURNOVER KIT A (KITS) IMPLANT
MANIFOLD NEPTUNE II (INSTRUMENTS) ×3 IMPLANT
PACK CYSTO (CUSTOM PROCEDURE TRAY) ×3 IMPLANT
SHEATH URETERAL 12FRX28CM (UROLOGICAL SUPPLIES) IMPLANT
SHEATH URETERAL 12FRX35CM (MISCELLANEOUS) ×3 IMPLANT
STENT URET 6FRX26 CONTOUR (STENTS) ×3 IMPLANT
TUBING UROLOGY SET (TUBING) ×3 IMPLANT

## 2018-06-11 NOTE — Transfer of Care (Signed)
Immediate Anesthesia Transfer of Care Note  Patient: [name]  Procedure(s) Performed: CYSTOSCOPY WITH LEFT  URETEROSCOPY  AND STENT PLACEMENT (Left )  Patient Location: PACU  Anesthesia Type:General  Level of Consciousness: drowsy, patient cooperative and responds to stimulation  Airway & Oxygen Therapy: Patient Spontanous Breathing and Patient connected to face mask oxygen  Post-op Assessment: Report given to RN and Post -op Vital signs reviewed and stable  Post vital signs: Reviewed and stable  Last Vitals:  Vitals Value Taken Time  BP 132/82 06/11/2018  1:51 PM  Temp    Pulse 51 06/11/2018  1:53 PM  Resp 16 06/11/2018  1:53 PM  SpO2 100 % 06/11/2018  1:53 PM  Vitals shown include unvalidated device data.  Last Pain:  Vitals:   06/11/18 1233  TempSrc:   PainSc: 0-No pain      Patients Stated Pain Goal: 4 (17/71/16 5790)  Complications: No apparent anesthesia complications

## 2018-06-11 NOTE — Anesthesia Procedure Notes (Signed)
Procedure Name: LMA Insertion Date/Time: 06/11/2018 1:11 PM Performed by: Glory Buff, CRNA Pre-anesthesia Checklist: Patient identified, Emergency Drugs available, Suction available and Patient being monitored Patient Re-evaluated:Patient Re-evaluated prior to induction Oxygen Delivery Method: Circle system utilized Preoxygenation: Pre-oxygenation with 100% oxygen Induction Type: IV induction LMA: LMA inserted LMA Size: 4.0 Number of attempts: 1 Placement Confirmation: positive ETCO2 Tube secured with: Tape Dental Injury: Teeth and Oropharynx as per pre-operative assessment

## 2018-06-11 NOTE — Discharge Instructions (Signed)

## 2018-06-11 NOTE — Op Note (Signed)
Operative Note  Preoperative diagnosis:  1.  Left renal mass  Postoperative diagnosis: 1.  Left renal mass  Procedure(s): 1.  Cystoscopy, left retrograde pyelogram, left diagnostic ureteroscopy, left ureteral stent placement  Surgeon: Link Snuffer, MD  Assistants: None  Anesthesia: General  Complications: None immediate  EBL: Minimal  Specimens: 1.  None  Drains/Catheters: 1. 6 x 26 double-J ureteral stent   Intraoperative findings: 1.  Normal anterior urethra 2.  Normal bladder mucosa except for areas of previous biopsy and fulguration.  Left retrograde pyelogram revealed no evidence of filling defect.  Left ureteroscopy confirmed that there was no mass within the collecting system.   Indication: 78 year old male recently found to have a large left renal mass.  He has elected to undergo ureteroscopy to rule out urothelial cell carcinoma.  Description of procedure:  The patient was identified and consent was obtained.  The patient was taken to the operating room and placed in the supine position.  The patient was placed under general anesthesia.  Perioperative antibiotics were administered.  The patient was placed in dorsal lithotomy.  Patient was prepped and draped in a standard sterile fashion and a timeout was performed.  A 21 French rigid cystoscope was advanced into the urethra and into the bladder.  Complete cystoscopy was performed with findings noted above.  The left ureter was cannulated with a sensor wire which was advanced up to the kidney under fluoroscopic guidance.  The scope was withdrawn and a dual-lumen ureteral catheter was advanced over the wire and into the distal ureter under fluoroscopic guidance.  Retrograde pyelogram was performed with the findings noted above.  I then advanced the dual-lumen ureteral catheter over the wire under continuous fluoroscopic guidance for passive dilation of the ureter.  I advanced a second wire through the ureteral catheter into  the kidney under fluoroscopic guidance followed by removal of the catheter.  I secured 1 of the wires to the drape as a safety wire.  The other wire was used to advance a 12 x 14 ureteral access sheath over the wire under continuous fluoroscopic guidance.  The wire and inner sheath were withdrawn.  I then performed a flexible ureteroscopy and inspected every calyx systematically.  There was no mass seen within the collecting system.  I therefore withdrew the scope along with the access sheath visualizing the ureter upon removal.  There is no significant injury/trauma to the ureter.  No ureteral calculi or masses.  I backloaded the wire onto a rigid cystoscope which was advanced into the bladder followed by routine placement of a 6 x 26 double-J ureteral stent in a standard fashion followed by removal of the wire.  Fluoroscopy confirmed proximal placement and direct visualization confirmed a good coil within the bladder.  I drained the bladder withdrew the scope.  This include the operation.  The patient tolerated the procedure well and was stable postoperatively.  Plan: Follow-up in 1 week for stent removal and discussion of nephrectomy.

## 2018-06-11 NOTE — Anesthesia Postprocedure Evaluation (Signed)
Anesthesia Post Note  Patient: [name]  Procedure(s) Performed: CYSTOSCOPY WITH LEFT  URETEROSCOPY  AND STENT PLACEMENT (Left )     Patient location during evaluation: PACU Anesthesia Type: General Level of consciousness: awake and alert Pain management: pain level controlled Vital Signs Assessment: post-procedure vital signs reviewed and stable Respiratory status: spontaneous breathing, nonlabored ventilation and respiratory function stable Cardiovascular status: blood pressure returned to baseline and stable Postop Assessment: no apparent nausea or vomiting Anesthetic complications: no    Last Vitals:  Vitals:   06/11/18 1430 06/11/18 1447  BP: (!) 148/85 140/80  Pulse:  (!) 52  Resp:  15  Temp:  (!) 36.3 C  SpO2:  97%    Last Pain:  Vitals:   06/11/18 1430  TempSrc:   PainSc: 0-No pain                 Brennan Bailey

## 2018-06-11 NOTE — H&P (Signed)
CC/HPI: Cc: Gross hematuria, left renal mass.  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 He started having gross hematuria several days ago. He is on Brillenta for a history of cardiovascular disease. He had a balloon angioplasty of his heart back in January. He was told to stop his blood thinner yesterday. His last dose was yesterday. He underwent a CT hematuria protocol yesterday. This revealed a large amount of clot in his bladder with evidence of retention. 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 His son provides most of the history given his history of dementia.   05/23/2018  Patient returns after undergoing a cystoscopy, clot evacuation, bladder biopsy. He is doing well. Urine has been clear for the most part. Pathology of the bladder was benign. Given his blood thinner status, I decided not to perform a ureteroscopy with biopsy and decided to instead wait until blood thinner, had worn off. I would also like to obtain cardiac clearance before performing more invasive procedures.   05/30/2018: Failed trial of void at last office visit. Catheter replaced and patient started on tamsulosin. Unfortunately he had hypotension with associated lightheadedness and dizziness with the alpha blocker therapy and after discussion with PCP patient held blood pressure medication as well as tamsulosin. He remains off both of those medications at this time.     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 Has not smoked since 05/04/1983.   Tobacco Use Assessment Completed: Used Tobacco in last 30 days? Drinks 4+ caffeinated drinks per day.    REVIEW OF SYSTEMS:    GU Review Male:   Patient denies frequent urination, hard to postpone urination, burning/ pain with urination, get up at night to urinate, leakage of urine, stream starts and stops, trouble starting your stream, have to strain to urinate , erection problems, and penile pain.  Gastrointestinal (Upper):   Patient denies nausea, vomiting, and indigestion/ heartburn.  Gastrointestinal (Lower):   Patient denies diarrhea and constipation.  Constitutional:   Patient denies fever, night sweats, weight loss, and fatigue.  Skin:   Patient denies skin rash/ lesion and itching.  Eyes:   Patient denies blurred vision and double vision.  Ears/ Nose/ Throat:   Patient denies sore throat and sinus problems.  Hematologic/Lymphatic:   Patient denies swollen glands and easy bruising.  Cardiovascular:   Patient denies leg swelling and chest pains.  Respiratory:   Patient denies cough and shortness of breath.  Endocrine:   Patient denies excessive thirst.  Musculoskeletal:   Patient denies back pain and joint pain.  Neurological:   Patient denies headaches and dizziness.  Psychologic:   Patient denies depression and anxiety.   Notes: has foley catheter    VITAL SIGNS:      05/30/2018 09:13 AM  Weight 198 lb / 89.81 kg  BP 125/79 mmHg  Pulse 61 /min  Temperature 98.0 F / 36.6 C   GU PHYSICAL EXAMINATION:    Penis: Penile foley catheter present draining clear-yellow urine.   MULTI-SYSTEM PHYSICAL EXAMINATION:    Constitutional: Well-nourished. No physical deformities. Normally developed. Good grooming.  Respiratory: No labored breathing, no use of accessory muscles.   Cardiovascular: Normal temperature, adequate  perfusion of extremities  Skin: No paleness, no jaundice  Neurologic / Psychiatric: Moderately verbal, follows commands.  Gastrointestinal: No mass, no tenderness, no rigidity, non obese abdomen.  Musculoskeletal: Normal gait and station of head and neck.     PAST DATA REVIEWED:  Source Of History:  Patient, Medical Record Summary, Family/Caregiver  Records Review:   Previous Doctor Records, Previous Hospital Records, Previous Patient Records  Urine Test Review:   Urine Culture   PROCEDURES:         Voiding Trial - 51700  Instilled Volume: 280 cc  Voided Volume: 100 cc   ASSESSMENT:      ICD-10 Details  1 GU:   Urinary Retention - R33.8    PLAN:            Medications New Meds: Alfuzosin Hcl Er 10 mg tablet, extended release 24 hr 1 tablet PO Q HS   #30  11 Refill(s)    Stop Meds: Tamsulosin Hcl 0.4 mg capsule 1 capsule PO Daily  Start: 05/23/2018  Stop: 12/19/2018   Discontinue: 05/30/2018  - Reason: The patient suffered unacceptable side effects.            Schedule Return Visit/Planned Activity: Keep Scheduled Appointment - Follow up MD, Schedule Surgery          Document Letter(s):  Created for Patient: Clinical Summary         Notes:   TOV more successful than on previous attempt. I'll have him d/c tamsulosin altogether in favor of alfuzosin. I'm hopeful this will have less of the systemic effect than tamsulosin. Re-communicated side effects and reportable of versed reactions. Return precautions discussed in the event patient unable to void or with other worsening lower urinary tract symptoms over the course of the next several hours, remainder of the week. Therefore, he'll proceed with planned procedure with his urologist on 6/8.        Next Appointment:      Next Appointment: 06/11/2018 03:00 PM    Appointment Type: Surgery     Location: [facility] [phone]    Provider: Link Snuffer, III, M.D.    Reason for Visit: OP [facility] CYSTO LT URS BX STENT      Signed by Jiles Crocker on 05/30/18 at 10:21 AM (EDT

## 2018-06-12 ENCOUNTER — Encounter (HOSPITAL_COMMUNITY): Payer: Self-pay | Admitting: Urology

## 2018-06-20 DIAGNOSIS — R310 Gross hematuria: Secondary | ICD-10-CM | POA: Diagnosis not present

## 2018-06-20 DIAGNOSIS — D4102 Neoplasm of uncertain behavior of left kidney: Secondary | ICD-10-CM | POA: Diagnosis not present

## 2018-06-21 ENCOUNTER — Other Ambulatory Visit: Payer: Self-pay | Admitting: Urology

## 2018-06-21 ENCOUNTER — Telehealth: Payer: Self-pay | Admitting: Cardiology

## 2018-06-21 NOTE — Telephone Encounter (Signed)
   Primary Cardiologist: Peter Martinique, MD  Chart reviewed as part of pre-operative protocol coverage. Given past medical history and time since last visit, based on ACC/AHA guidelines, [name] would be at acceptable risk for the planned procedure without further cardiovascular testing. Patient was contacted 05/23/2018 in reference to pre-operative risk assessment for pending surgery as outlined below. [name] was last seen on 02/19/18 by [name] PAC. Since that day, [name] has done well from a cardiac perspective. 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 He has dementia, however previous PA spoke with his son (POA). Unfortunately, his PCP stopped his brilinta due to significant bladder clot and hematuria. He is scheduled for a left reticle nephrolithotomy. There is no current request to hold medications and per previous clearance from 05/23/2018 and per Dr. Martinique, his ASA was not recommended to be stopped given MI in 01/2018.   I will route this recommendation to the requesting party via Epic fax function and remove from pre-op pool.  Please call with questions.  Allen Drown, NP 06/21/2018, 3:55 PM

## 2018-06-21 NOTE — Telephone Encounter (Signed)
New Message         [facility] Pre-operative Risk Assessment    Request for surgical clearance:  1. What type of surgery is being performed? Left reticle  nephrolithotomy    2. When is this surgery scheduled? 07/11/18   3. What type of clearance is required (medical clearance vs. Pharmacy clearance to hold med vs. Both)? Medical   4. Are there any medications that need to be held prior to surgery and how long? No    5. Practice name and name of physician performing surgery? [facility] Dr Link Snuffer    6. What is your office phone number [phone]   7.   What is your office fax number [phone]  8.   Anesthesia type (None, local, MAC, general) ? General    [name] 06/21/2018, 2:51 PM  _________________________________________________________________   (provider comments below)

## 2018-07-02 NOTE — Patient Instructions (Addendum)
Pietro Cassis    Your procedure is scheduled on: 07-11-2018   Report to [facility] Main  Entrance  Report to admitting at  6:35 AM    YOU NEED TO HAVE A COVID 19 TEST ON   MONDAY 7/6_______ @_______ , THIS TEST MUST BE DONE BEFORE SURGERY, COME TO [place].  ONCE YOUR COVID TEST IS COMPLETED, PLEASE BEGIN THE QUARANTINE INSTRUCTIONS AS OUTLINED IN YOUR HANDOUT.    Call this number if you have problems the morning of surgery [phone]     Remember: Do not eat food or drink liquids :After Midnight.             BRUSH YOUR TEETH MORNING OF SURGERY AND RINSE YOUR MOUTH OUT, NO CHEWING GUM CANDY OR MINTS.     Take these medicines the morning of surgery with A SIP OF WATER:  Zyrtec, Toprol XL, Nexium, Namenda              You may not have any metal on your body including piercings              Do not wear jewelry, lotions, powders or  deodorant                          Men may shave face and neck.   Do not bring valuables to the hospital. [place].  Contacts, dentures or bridgework may not be worn into surgery.            [place] - Preparing for Surgery  Before surgery, you can play an important role .  Because skin is not sterile, your skin needs to be as free of germs as possible.   You can reduce the number of germs on your skin by washing with CHG (chlorahexidine gluconate) soap before surgery.   CHG is an antiseptic cleaner which kills germs and bonds with the skin to continue killing germs even after washing. Please DO NOT use if you have an allergy to CHG or antibacterial soaps.   If your skin becomes reddened/irritated stop using the CHG and inform your nurse when you arrive at Short Stay. .  You may shave your face/neck . Please follow these instructions carefully:  1.  Shower with CHG Soap the night before surgery and the  morning of Surgery.  2.  If  you choose to wash your hair, wash your hair first as usual with your  normal  shampoo.  3.  After you shampoo, rinse your hair and body thoroughly to remove the  shampoo.                                        4.  Use CHG as you would any other liquid soap.  You can apply chg directly  to the skin and wash                       Gently with a scrungie or clean washcloth.  5.  Apply the CHG Soap to your body ONLY FROM THE NECK DOWN.   Do not use on face/ open  Wound or open sores. Avoid contact with eyes, ears mouth and genitals (private parts).                       Wash face,  Genitals (private parts) with your normal soap.             6.  Wash thoroughly, paying special attention to the area where your surgery  will be performed.  7.  Thoroughly rinse your body with warm water from the neck down.  8.  DO NOT shower/wash with your normal soap after using and rinsing off  the CHG Soap.             9.  Pat yourself dry with a clean towel.            10.  Wear clean pajamas.            11.  Place clean sheets on your bed the night of your first shower and do not  sleep with pets.  Day of Surgery : Do not apply any lotions/deodorants the morning of surgery.  Please wear clean clothes to the hospital/surgery center.   FAILURE TO FOLLOW THESE INSTRUCTIONS MAY RESULT IN THE CANCELLATION OF YOUR SURGERY PATIENT SIGNATURE_________________________________  NURSE SIGNATURE__________________________________  ________________________________________________________________________

## 2018-07-03 ENCOUNTER — Other Ambulatory Visit: Payer: Self-pay

## 2018-07-03 ENCOUNTER — Encounter (HOSPITAL_COMMUNITY)
Admission: RE | Admit: 2018-07-03 | Discharge: 2018-07-03 | Disposition: A | Discharge status: Home / Self Care | Payer: Medicare Other | Source: Ambulatory Visit | Attending: Urology | Admitting: Urology

## 2018-07-03 ENCOUNTER — Encounter (HOSPITAL_COMMUNITY): Payer: Self-pay

## 2018-07-03 DIAGNOSIS — N2889 Other specified disorders of kidney and ureter: Secondary | ICD-10-CM | POA: Diagnosis not present

## 2018-07-03 DIAGNOSIS — Z01812 Encounter for preprocedural laboratory examination: Secondary | ICD-10-CM | POA: Diagnosis not present

## 2018-07-03 LAB — BASIC METABOLIC PANEL
Anion gap: 9 (ref 5–15)
BUN: 14 mg/dL (ref 8–23)
CO2: 27 mmol/L (ref 22–32)
Calcium: 8.6 mg/dL — ABNORMAL LOW (ref 8.9–10.3)
Chloride: 104 mmol/L (ref 98–111)
Creatinine, Ser: 0.96 mg/dL (ref 0.61–1.24)
GFR calc Af Amer: >60 mL/min (ref >60)
GFR calc non Af Amer: >60 mL/min (ref >60)
Glucose, Bld: 108 mg/dL — ABNORMAL HIGH (ref 70–99)
Potassium: 3.4 mmol/L — ABNORMAL LOW (ref 3.5–5.1)
Sodium: 140 mmol/L (ref 135–145)

## 2018-07-03 LAB — CBC
HCT: 35.7 % — ABNORMAL LOW (ref 39.0–52.0)
Hemoglobin: 10.9 g/dL — ABNORMAL LOW (ref 13.0–17.0)
MCH: 28.1 pg (ref 26.0–34.0)
MCHC: 30.5 g/dL (ref 30.0–36.0)
MCV: 92 fL (ref 80.0–100.0)
Platelets: 197 10*3/uL (ref 150–400)
RBC: 3.88 MIL/uL — ABNORMAL LOW (ref 4.22–5.81)
RDW: 13.5 % (ref 11.5–15.5)
WBC: 5.9 10*3/uL (ref 4.0–10.5)
nRBC: 0 % (ref 0.0–0.2)

## 2018-07-03 LAB — PROTIME-INR
INR: 1.1 (ref 0.8–1.2)
Prothrombin Time: 13.9 seconds (ref 11.4–15.2)

## 2018-07-05 NOTE — Anesthesia Preprocedure Evaluation (Addendum)
Anesthesia Evaluation  Patient identified by MRN, date of birth, ID band Patient awake    Reviewed: Allergy & Precautions, NPO status , Patient's Chart, lab work & pertinent test results, reviewed documented beta blocker date and time   Airway Mallampati: II  TM Distance: >3 FB Neck ROM: Full    Dental  (+) Poor Dentition, Missing, Chipped,    Pulmonary former smoker,    Pulmonary exam normal breath sounds clear to auscultation       Cardiovascular hypertension, Pt. on medications and Pt. 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 The left ventricle appears to be normal in size, has moderate wall thickness 30-35% ejection fraction Spectral Doppler shows impaired relaxation pattern of diastolic filling.  2. There is severe hypokinesis of the entire septal and inferior left ventricular segments.  3. The right ventricle has normal size and mildly reduced systolic function.  4. Right ventricular systolic pressure is could not be assessed.  5. Normal left atrial size.  6. Normal right atrial size.  7. Mitral valve regurgitation is trivial by color flow Doppler.  8. No atrial level shunt detected by color flow Doppler  Cardiac Cath 01/30/2018  Dist RCA-1 lesion is 50% stenosed.  Dist RCA-2 lesion is 100% stenosed.  Dist LM to Ost LAD lesion is 80% stenosed.  Prox LAD lesion is 75% stenosed.  Prox LAD to Mid LAD lesion is 80% stenosed.  1st Diag lesion is 60% stenosed.  Dist Cx lesion is 50% stenosed.  Balloon angioplasty was performed using a BALLOON SAPPHIRE 2.0X15.  Post intervention, there is a 0% residual stenosis.  Post intervention, there is a 25% residual stenosis.  Balloon angioplasty was performed  using a BALLOON SAPPHIRE 2.5X15.  LV end diastolic pressure is normal.   1. 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 Normal LVEDP 3. Successful PTCA only of the distal RCA into the PDA    Neuro/Psych PSYCHIATRIC DISORDERS Dementia negative neurological ROS     GI/Hepatic Neg liver ROS,   Endo/Other  Hyperlipidemia  Renal/GU Left renal mass- normal renal function  negative genitourinary   Musculoskeletal   Abdominal   Peds  Hematology   Anesthesia Other Findings   Reproductive/Obstetrics                          Anesthesia Physical Anesthesia Plan  ASA: III  Anesthesia Plan: General   Post-op Pain Management:    Induction: Intravenous  PONV Risk Score and Plan: 4 or greater and Ondansetron, Dexamethasone and Treatment may vary due to age or medical condition  Airway Management Planned: Oral ETT  Additional Equipment: Arterial line  Intra-op Plan:   Post-operative Plan: Extubation in OR  Informed Consent: I have reviewed the patients History and Physical, chart, labs and discussed the procedure including the risks, benefits and alternatives for the proposed anesthesia with the patient or authorized representative who has indicated his/her understanding and acceptance.     Dental advisory given  Plan Discussed  with: CRNA  Anesthesia Plan Comments: (See PAT note 07/03/2018, Konrad Felix, PA-C)      Anesthesia Quick Evaluation

## 2018-07-05 NOTE — Progress Notes (Signed)
Anesthesia Chart Review   Case: [id number] Date/Time: 07/11/18 0815   Procedure: LEFT HAND ASSISTED LAPAROSCOPIC RADICAL NEPHRECTOMY (Left )   Anesthesia type: General   Pre-op diagnosis: LEFT RENAL MASS   Location: [place] [facility]   Surgeon: Lucas Mallow, MD      DISCUSSION:78 y.o. former smoker (25 pack years, quit 02/13/88) with h/o HTN, MI (01/30/2018 PTCA only of the distal RCA into the PDA, Brillinta stopped due to bladder clot and hematuria, continues on ASA, recommended not to stop due to MI 01/2018), dementia (son has POA), left renal mass scheduled for above procedure 07/11/2018 with Dr. Link Snuffer.   Cleared by cardiolgy 06/21/2018.  Per Kathyrn Drown, NP, "Given past medical history and time since last visit, based on ACC/AHA guidelines, Daiden Coltrane would be at acceptable risk for the planned procedure without further cardiovascular testing. Patient was contacted5/20/2020in reference to pre-operative risk assessment for pending surgery as outlined below. Gwenyth Ober last seen on 2/17/20by Almyra Deforest PAC. 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 He has dementia, however previous PA spoke with his son (POA). Unfortunately, his PCP stopped his brilinta due to significant bladder clot and hematuria. He is scheduled for a left reticle nephrolithotomy. There is no current request to hold medications and per previous clearance from 05/23/2018 and per Dr. Martinique, his ASA was not recommended to be stopped given MI in 01/2018."  Anticipate pt can proceed with planned procedure barring acute status change.   VS: BP 136/74 (BP Location: Right Arm)   Pulse (!) 57   Temp 36.5 C (Oral)   Resp 18   Ht 5\' 11"  (1.803 m)   Wt 89.9 kg   SpO2 100%   BMI 27.64 kg/m   PROVIDERS: Claretta Fraise, MD is PCP   Martinique, Peter, MD is Cardiologist  LABS: Labs reviewed: Acceptable for surgery. (all labs ordered are  listed, but only abnormal results are displayed)  Labs Reviewed  BASIC METABOLIC PANEL - Abnormal; Notable for the following components:      Result Value   Potassium 3.4 (*)    Glucose, Bld 108 (*)    Calcium 8.6 (*)    All other components within normal limits  CBC - Abnormal; Notable for the following components:   RBC 3.88 (*)    Hemoglobin 10.9 (*)    HCT 35.7 (*)    All other components within normal limits  PROTIME-INR  TYPE AND SCREEN     IMAGES:   EKG: 02/19/2018 Rate 53 bpm Sinus bradycardia, inferior infarct  CV: Echo 01/31/2018 IMPRESSIONS    1. The left ventricle appears to be normal in size, has moderate wall thickness 30-35% ejection fraction Spectral Doppler shows impaired relaxation pattern of diastolic filling.  2. There is severe hypokinesis of the entire septal and inferior left ventricular segments.  3. The right ventricle has normal size and mildly reduced systolic function.  4. Right ventricular systolic pressure is could not be assessed.  5. Normal left atrial size.  6. Normal right atrial size.  7. Mitral valve regurgitation is trivial by color flow Doppler.  8. No atrial level shunt detected by color flow Doppler.  Cardiac Cath 01/30/2018  Dist RCA-1 lesion is 50% stenosed.  Dist RCA-2 lesion is 100% stenosed.  Dist LM to Ost LAD lesion is 80% stenosed.  Prox LAD lesion is 75% stenosed.  Prox LAD to Mid LAD lesion is  80% stenosed.  1st Diag lesion is 60% stenosed.  Dist Cx lesion is 50% stenosed.  Balloon angioplasty was performed using a BALLOON SAPPHIRE 2.0X15.  Post intervention, there is a 0% residual stenosis.  Post intervention, there is a 25% residual stenosis.  Balloon angioplasty was performed using a BALLOON SAPPHIRE 2.5X15.  LV end diastolic pressure is normal.   1. 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 Normal LVEDP 3. Successful PTCA only of the distal RCA into the PDA   Plan: DAPT for one year. Will continue IV Aggrastat for 4 hours. Given dementia I do not feel that patient is a candidate for CABG. Will need to treat disease in the LCA medically. Past Medical History:  Diagnosis Date  . Dementia [facility])    Son has POA  . Hypertension   . Myocardial infarction [facility])     Past Surgical History:  Procedure Laterality Date  . CORONARY/GRAFT ACUTE MI REVASCULARIZATION N/A 01/30/2018   Procedure: Coronary/Graft Acute MI Revascularization;  Surgeon: Martinique, Peter M, MD;  Location: [place] [facility];  Service: Cardiovascular;  Laterality: N/A;  . CYSTOSCOPY WITH RETROGRADE PYELOGRAM, URETEROSCOPY AND STENT PLACEMENT N/A 05/18/2018   Procedure: CYSTOSCOPY;  Surgeon: Lucas Mallow, MD;  Location: [facility];  Service: Urology;  Laterality: N/A;  . CYSTOSCOPY WITH URETEROSCOPY AND STENT PLACEMENT Left 06/11/2018   Procedure: CYSTOSCOPY WITH LEFT  URETEROSCOPY  AND STENT PLACEMENT;  Surgeon: Lucas Mallow, MD;  Location: [facility];  Service: Urology;  Laterality: Left;  . LEFT HEART CATH AND CORONARY ANGIOGRAPHY N/A 01/30/2018   Procedure: LEFT HEART CATH AND CORONARY ANGIOGRAPHY;  Surgeon: Martinique, Peter M, MD;  Location: [place] [facility];  Service: Cardiovascular;  Laterality: N/A;  . TRANSURETHRAL RESECTION OF BLADDER TUMOR N/A 05/18/2018   Procedure: TRANSURETHRAL RESECTION OF BLADDER TUMOR (TURBT), CLOT EVACUATION;  Surgeon: Lucas Mallow, MD;  Location: [facility];  Service: Urology;  Laterality: N/A;    MEDICATIONS: . alfuzosin (UROXATRAL) 10 MG 24 hr tablet  . aspirin 81 MG chewable tablet  . atorvastatin (LIPITOR) 80 MG tablet  . cetirizine (ZYRTEC) 10 MG tablet  . donepezil (ARICEPT) 10 MG tablet  . esomeprazole (NEXIUM) 20 MG capsule  . furosemide (LASIX) 20 MG tablet   . HYDROcodone-acetaminophen (NORCO) 5-325 MG tablet  . memantine (NAMENDA) 10 MG tablet  . metoprolol succinate (TOPROL-XL) 25 MG 24 hr tablet  . nitroGLYCERIN (NITROSTAT) 0.4 MG SL tablet   No current facility-administered medications for this encounter.     Maia Plan [facility] Pre-Surgical Testing [phone] 07/05/18  9:02 AM

## 2018-07-09 ENCOUNTER — Other Ambulatory Visit: Payer: Self-pay | Admitting: Family Medicine

## 2018-07-09 ENCOUNTER — Other Ambulatory Visit: Payer: Self-pay | Admitting: Physician Assistant

## 2018-07-09 ENCOUNTER — Other Ambulatory Visit (HOSPITAL_COMMUNITY)
Admission: RE | Admit: 2018-07-09 | Discharge: 2018-07-09 | Disposition: A | Discharge status: Home / Self Care | Payer: Medicare Other | Source: Ambulatory Visit | Attending: Urology | Admitting: Urology

## 2018-07-09 DIAGNOSIS — Z1159 Encounter for screening for other viral diseases: Secondary | ICD-10-CM | POA: Insufficient documentation

## 2018-07-09 DIAGNOSIS — Z01812 Encounter for preprocedural laboratory examination: Secondary | ICD-10-CM | POA: Insufficient documentation

## 2018-07-10 ENCOUNTER — Encounter (HOSPITAL_COMMUNITY): Payer: Self-pay | Admitting: Anesthesiology

## 2018-07-10 LAB — SARS CORONAVIRUS 2 (TAT 6-24 HRS): SARS Coronavirus 2: NEGATIVE

## 2018-07-11 ENCOUNTER — Inpatient Hospital Stay (HOSPITAL_COMMUNITY): Payer: Medicare Other | Admitting: Anesthesiology

## 2018-07-11 ENCOUNTER — Other Ambulatory Visit: Payer: Self-pay

## 2018-07-11 ENCOUNTER — Inpatient Hospital Stay (HOSPITAL_COMMUNITY): Payer: Medicare Other | Admitting: Physician Assistant

## 2018-07-11 ENCOUNTER — Encounter (HOSPITAL_COMMUNITY): Payer: Self-pay | Admitting: Certified Registered"

## 2018-07-11 ENCOUNTER — Encounter (HOSPITAL_COMMUNITY)
Admission: RE | Disposition: A | Discharge status: Home / Self Care | Payer: Self-pay | Source: Home / Self Care | Attending: Urology

## 2018-07-11 ENCOUNTER — Inpatient Hospital Stay (HOSPITAL_COMMUNITY)
Admission: RE | Admit: 2018-07-11 | Discharge: 2018-07-13 | DRG: 658 | Disposition: A | Discharge status: Home / Self Care | Payer: Medicare Other | Attending: Urology | Admitting: Urology

## 2018-07-11 DIAGNOSIS — I252 Old myocardial infarction: Secondary | ICD-10-CM | POA: Diagnosis not present

## 2018-07-11 DIAGNOSIS — R310 Gross hematuria: Secondary | ICD-10-CM | POA: Diagnosis present

## 2018-07-11 DIAGNOSIS — Z823 Family history of stroke: Secondary | ICD-10-CM | POA: Diagnosis not present

## 2018-07-11 DIAGNOSIS — I340 Nonrheumatic mitral (valve) insufficiency: Secondary | ICD-10-CM | POA: Diagnosis not present

## 2018-07-11 DIAGNOSIS — F0390 Unspecified dementia without behavioral disturbance: Secondary | ICD-10-CM | POA: Diagnosis present

## 2018-07-11 DIAGNOSIS — Z9861 Coronary angioplasty status: Secondary | ICD-10-CM | POA: Diagnosis not present

## 2018-07-11 DIAGNOSIS — N2889 Other specified disorders of kidney and ureter: Secondary | ICD-10-CM | POA: Diagnosis not present

## 2018-07-11 DIAGNOSIS — Z87891 Personal history of nicotine dependence: Secondary | ICD-10-CM | POA: Diagnosis not present

## 2018-07-11 DIAGNOSIS — E7800 Pure hypercholesterolemia, unspecified: Secondary | ICD-10-CM | POA: Diagnosis present

## 2018-07-11 DIAGNOSIS — I10 Essential (primary) hypertension: Secondary | ICD-10-CM | POA: Diagnosis present

## 2018-07-11 DIAGNOSIS — I2510 Atherosclerotic heart disease of native coronary artery without angina pectoris: Secondary | ICD-10-CM | POA: Diagnosis present

## 2018-07-11 DIAGNOSIS — D4102 Neoplasm of uncertain behavior of left kidney: Secondary | ICD-10-CM | POA: Diagnosis not present

## 2018-07-11 DIAGNOSIS — C642 Malignant neoplasm of left kidney, except renal pelvis: Secondary | ICD-10-CM | POA: Diagnosis present

## 2018-07-11 DIAGNOSIS — Z791 Long term (current) use of non-steroidal anti-inflammatories (NSAID): Secondary | ICD-10-CM | POA: Diagnosis not present

## 2018-07-11 DIAGNOSIS — K219 Gastro-esophageal reflux disease without esophagitis: Secondary | ICD-10-CM | POA: Diagnosis present

## 2018-07-11 DIAGNOSIS — Z7982 Long term (current) use of aspirin: Secondary | ICD-10-CM | POA: Diagnosis not present

## 2018-07-11 HISTORY — PX: LAPAROSCOPIC NEPHRECTOMY: SHX1930

## 2018-07-11 LAB — BASIC METABOLIC PANEL
Anion gap: 8 (ref 5–15)
BUN: 18 mg/dL (ref 8–23)
CO2: 25 mmol/L (ref 22–32)
Calcium: 8.9 mg/dL (ref 8.9–10.3)
Chloride: 107 mmol/L (ref 98–111)
Creatinine, Ser: 1 mg/dL (ref 0.61–1.24)
GFR calc Af Amer: >60 mL/min (ref >60)
GFR calc non Af Amer: >60 mL/min (ref >60)
Glucose, Bld: 152 mg/dL — ABNORMAL HIGH (ref 70–99)
Potassium: 3.7 mmol/L (ref 3.5–5.1)
Sodium: 140 mmol/L (ref 135–145)

## 2018-07-11 LAB — HEMOGLOBIN AND HEMATOCRIT, BLOOD
HCT: 31.2 % — ABNORMAL LOW (ref 39.0–52.0)
Hemoglobin: 9.7 g/dL — ABNORMAL LOW (ref 13.0–17.0)

## 2018-07-11 LAB — TYPE AND SCREEN
ABO/RH(D): O POS
Antibody Screen: NEGATIVE

## 2018-07-11 SURGERY — NEPHRECTOMY, RADICAL, LAPAROSCOPIC, ADULT
Anesthesia: General | Site: Abdomen | Laterality: Left

## 2018-07-11 MED ORDER — ACETAMINOPHEN 500 MG PO TABS
1000.0000 mg | ORAL_TABLET | Freq: Four times a day (QID) | ORAL | Status: AC
  Administered 2018-07-11 – 2018-07-12 (×3): 1000 mg via ORAL
  Filled 2018-07-11 (×4): qty 2

## 2018-07-11 MED ORDER — DEXAMETHASONE SODIUM PHOSPHATE 10 MG/ML IJ SOLN
INTRAMUSCULAR | Status: AC
  Filled 2018-07-11: qty 1

## 2018-07-11 MED ORDER — ONDANSETRON HCL 4 MG/2ML IJ SOLN: 4.0000 mg | Freq: Once | INTRAMUSCULAR | Status: DC | PRN

## 2018-07-11 MED ORDER — ONDANSETRON HCL 4 MG/2ML IJ SOLN: 4.0000 mg | INTRAMUSCULAR | Status: DC | PRN

## 2018-07-11 MED ORDER — CEFAZOLIN SODIUM-DEXTROSE 2-4 GM/100ML-% IV SOLN
INTRAVENOUS | Status: AC
  Filled 2018-07-11: qty 100

## 2018-07-11 MED ORDER — SODIUM CHLORIDE (PF) 0.9 % IJ SOLN
INTRAMUSCULAR | Status: AC
  Filled 2018-07-11: qty 20

## 2018-07-11 MED ORDER — SUGAMMADEX SODIUM 200 MG/2ML IV SOLN
INTRAVENOUS | Status: DC | PRN
  Administered 2018-07-11: 350 mg via INTRAVENOUS

## 2018-07-11 MED ORDER — SENNOSIDES-DOCUSATE SODIUM 8.6-50 MG PO TABS
2.0000 | ORAL_TABLET | Freq: Every day | ORAL | Status: DC
  Administered 2018-07-11 – 2018-07-13 (×2): 2 via ORAL
  Filled 2018-07-11 (×2): qty 2

## 2018-07-11 MED ORDER — ESMOLOL HCL 100 MG/10ML IV SOLN
INTRAVENOUS | Status: DC | PRN
  Administered 2018-07-11: 20 mg via INTRAVENOUS
  Administered 2018-07-11: 10 mg via INTRAVENOUS

## 2018-07-11 MED ORDER — LACTATED RINGERS IV SOLN
INTRAVENOUS | Status: DC
  Administered 2018-07-11: 08:00:00 via INTRAVENOUS

## 2018-07-11 MED ORDER — LIDOCAINE HCL URETHRAL/MUCOSAL 2 % EX GEL
CUTANEOUS | Status: AC
  Filled 2018-07-11: qty 5

## 2018-07-11 MED ORDER — CALCIUM CHLORIDE 10 % IV SOLN
INTRAVENOUS | Status: DC | PRN
  Administered 2018-07-11 (×2): 250 mg via INTRAVENOUS

## 2018-07-11 MED ORDER — DEXAMETHASONE SODIUM PHOSPHATE 10 MG/ML IJ SOLN
INTRAMUSCULAR | Status: DC | PRN
  Administered 2018-07-11: 10 mg via INTRAVENOUS

## 2018-07-11 MED ORDER — FENTANYL CITRATE (PF) 100 MCG/2ML IJ SOLN
INTRAMUSCULAR | Status: DC | PRN
  Administered 2018-07-11: 50 ug via INTRAVENOUS
  Administered 2018-07-11 (×2): 25 ug via INTRAVENOUS
  Administered 2018-07-11 (×2): 50 ug via INTRAVENOUS
  Administered 2018-07-11: 25 ug via INTRAVENOUS

## 2018-07-11 MED ORDER — LIDOCAINE HCL URETHRAL/MUCOSAL 2 % EX GEL
CUTANEOUS | Status: DC | PRN
  Administered 2018-07-11: 1 via URETHRAL

## 2018-07-11 MED ORDER — SODIUM CHLORIDE 0.9% FLUSH
INTRAVENOUS | Status: DC | PRN
  Administered 2018-07-11: 20 mL

## 2018-07-11 MED ORDER — ALBUMIN HUMAN 5 % IV SOLN
INTRAVENOUS | Status: DC | PRN
  Administered 2018-07-11: 09:00:00 via INTRAVENOUS

## 2018-07-11 MED ORDER — CALCIUM CHLORIDE 10 % IV SOLN
INTRAVENOUS | Status: AC
  Filled 2018-07-11: qty 10

## 2018-07-11 MED ORDER — MORPHINE SULFATE (PF) 4 MG/ML IV SOLN
2.0000 mg | INTRAVENOUS | Status: DC | PRN
  Administered 2018-07-11: 4 mg via INTRAVENOUS
  Administered 2018-07-11: 2 mg via INTRAVENOUS
  Administered 2018-07-11: 4 mg via INTRAVENOUS
  Filled 2018-07-11 (×3): qty 1

## 2018-07-11 MED ORDER — PHENYLEPHRINE 40 MCG/ML (10ML) SYRINGE FOR IV PUSH (FOR BLOOD PRESSURE SUPPORT)
PREFILLED_SYRINGE | INTRAVENOUS | Status: DC | PRN
  Administered 2018-07-11 (×2): 80 ug via INTRAVENOUS

## 2018-07-11 MED ORDER — LACTATED RINGERS IV SOLN
INTRAVENOUS | Status: DC
  Administered 2018-07-11: 13:00:00 via INTRAVENOUS

## 2018-07-11 MED ORDER — ALBUMIN HUMAN 5 % IV SOLN
INTRAVENOUS | Status: AC
  Filled 2018-07-11: qty 250

## 2018-07-11 MED ORDER — 0.9 % SODIUM CHLORIDE (POUR BTL) OPTIME
TOPICAL | Status: DC | PRN
  Administered 2018-07-11: 10:00:00 1000 mL

## 2018-07-11 MED ORDER — MEPERIDINE HCL 50 MG/ML IJ SOLN: 6.2500 mg | INTRAMUSCULAR | Status: DC | PRN

## 2018-07-11 MED ORDER — LACTATED RINGERS IV SOLN
INTRAVENOUS | Status: AC | PRN
  Administered 2018-07-11: 1000 mL

## 2018-07-11 MED ORDER — OXYCODONE HCL 5 MG PO TABS: 5.0000 mg | ORAL_TABLET | ORAL | Status: DC | PRN

## 2018-07-11 MED ORDER — PROPOFOL 10 MG/ML IV BOLUS
INTRAVENOUS | Status: DC | PRN
  Administered 2018-07-11: 100 mg via INTRAVENOUS

## 2018-07-11 MED ORDER — PROPOFOL 10 MG/ML IV BOLUS
INTRAVENOUS | Status: AC
  Filled 2018-07-11: qty 20

## 2018-07-11 MED ORDER — LIDOCAINE 2% (20 MG/ML) 5 ML SYRINGE
INTRAMUSCULAR | Status: DC | PRN
  Administered 2018-07-11: 50 mg via INTRAVENOUS

## 2018-07-11 MED ORDER — OXYBUTYNIN CHLORIDE 5 MG PO TABS: 5.0000 mg | ORAL_TABLET | Freq: Three times a day (TID) | ORAL | Status: DC | PRN

## 2018-07-11 MED ORDER — ONDANSETRON HCL 4 MG/2ML IJ SOLN
INTRAMUSCULAR | Status: AC
  Filled 2018-07-11: qty 2

## 2018-07-11 MED ORDER — SUCCINYLCHOLINE CHLORIDE 200 MG/10ML IV SOSY
PREFILLED_SYRINGE | INTRAVENOUS | Status: DC | PRN
  Administered 2018-07-11: 100 mg via INTRAVENOUS

## 2018-07-11 MED ORDER — EPHEDRINE SULFATE-NACL 50-0.9 MG/10ML-% IV SOSY
PREFILLED_SYRINGE | INTRAVENOUS | Status: DC | PRN
  Administered 2018-07-11 (×5): 10 mg via INTRAVENOUS

## 2018-07-11 MED ORDER — ROCURONIUM BROMIDE 100 MG/10ML IV SOLN
INTRAVENOUS | Status: DC | PRN
  Administered 2018-07-11: 10 mg via INTRAVENOUS
  Administered 2018-07-11: 20 mg via INTRAVENOUS
  Administered 2018-07-11: 50 mg via INTRAVENOUS
  Administered 2018-07-11: 20 mg via INTRAVENOUS

## 2018-07-11 MED ORDER — CEFAZOLIN SODIUM-DEXTROSE 2-4 GM/100ML-% IV SOLN
2.0000 g | INTRAVENOUS | Status: AC
  Administered 2018-07-11: 09:00:00 2 g via INTRAVENOUS

## 2018-07-11 MED ORDER — GLYCOPYRROLATE 0.2 MG/ML IJ SOLN
INTRAMUSCULAR | Status: DC | PRN
  Administered 2018-07-11: 0.1 mg via INTRAVENOUS

## 2018-07-11 MED ORDER — FENTANYL CITRATE (PF) 100 MCG/2ML IJ SOLN
INTRAMUSCULAR | Status: AC
  Filled 2018-07-11: qty 2

## 2018-07-11 MED ORDER — ONDANSETRON HCL 4 MG/2ML IJ SOLN
INTRAMUSCULAR | Status: DC | PRN
  Administered 2018-07-11: 4 mg via INTRAVENOUS

## 2018-07-11 MED ORDER — BUPIVACAINE LIPOSOME 1.3 % IJ SUSP
20.0000 mL | Freq: Once | INTRAMUSCULAR | Status: AC
  Administered 2018-07-11: 10:00:00 20 mL
  Filled 2018-07-11: qty 20

## 2018-07-11 MED ORDER — FENTANYL CITRATE (PF) 100 MCG/2ML IJ SOLN
25.0000 ug | INTRAMUSCULAR | Status: DC | PRN
  Administered 2018-07-11 (×4): 25 ug via INTRAVENOUS

## 2018-07-11 MED ORDER — LACTATED RINGERS IV SOLN
INTRAVENOUS | Status: DC | PRN
  Administered 2018-07-11: 08:00:00 via INTRAVENOUS

## 2018-07-11 MED ORDER — FENTANYL CITRATE (PF) 250 MCG/5ML IJ SOLN
INTRAMUSCULAR | Status: AC
  Filled 2018-07-11: qty 5

## 2018-07-11 SURGICAL SUPPLY — 57 items
BAG LAPAROSCOPIC 12 15 PORT 16 (BASKET) IMPLANT
BAG RETRIEVAL 12/15 (BASKET)
BAG RETRIEVAL 12/15MM (BASKET)
BAG ZIPLOCK 12X15 (MISCELLANEOUS) ×3 IMPLANT
BLADE EXTENDED COATED 6.5IN (ELECTRODE) IMPLANT
BLADE SURG SZ10 CARB STEEL (BLADE) IMPLANT
CABLE HIGH FREQUENCY MONO STRZ (ELECTRODE) ×3 IMPLANT
CHLORAPREP W/TINT 26 (MISCELLANEOUS) ×3 IMPLANT
CLEANER TIP ELECTROSURG 2X2 (MISCELLANEOUS) IMPLANT
CLIP VESOLOCK LG 6/CT PURPLE (CLIP) ×3 IMPLANT
CLIP VESOLOCK MED LG 6/CT (CLIP) ×2 IMPLANT
CLIP VESOLOCK XL 6/CT (CLIP) ×2 IMPLANT
COVER SURGICAL LIGHT HANDLE (MISCELLANEOUS) ×3 IMPLANT
COVER WAND RF STERILE (DRAPES) IMPLANT
CUTTER FLEX LINEAR 45M (STAPLE) ×2 IMPLANT
DERMABOND ADVANCED (GAUZE/BANDAGES/DRESSINGS)
DERMABOND ADVANCED .7 DNX12 (GAUZE/BANDAGES/DRESSINGS) IMPLANT
DRAIN CHANNEL 10F 3/8 F FF (DRAIN) IMPLANT
DRAPE INCISE IOBAN 66X45 STRL (DRAPES) ×3 IMPLANT
DRESSING TELFA ISLAND 4X8 (GAUZE/BANDAGES/DRESSINGS) ×2 IMPLANT
DRSG TEGADERM 4X4.75 (GAUZE/BANDAGES/DRESSINGS) IMPLANT
DRSG TELFA PLUS 4X6 ADH ISLAND (GAUZE/BANDAGES/DRESSINGS) ×4 IMPLANT
ELECT PENCIL ROCKER SW 15FT (MISCELLANEOUS) ×3 IMPLANT
ELECT REM PT RETURN 15FT ADLT (MISCELLANEOUS) ×3 IMPLANT
EVACUATOR SILICONE 100CC (DRAIN) IMPLANT
GLOVE BIO SURGEON STRL SZ7.5 (GLOVE) ×3 IMPLANT
GOWN STRL REUS W/TWL LRG LVL3 (GOWN DISPOSABLE) ×6 IMPLANT
GOWN STRL REUS W/TWL XL LVL3 (GOWN DISPOSABLE) ×3 IMPLANT
HEMOSTAT SURGICEL 4X8 (HEMOSTASIS) IMPLANT
IRRIG SUCT STRYKERFLOW 2 WTIP (MISCELLANEOUS) ×3
IRRIGATION SUCT STRKRFLW 2 WTP (MISCELLANEOUS) IMPLANT
KIT BASIN OR (CUSTOM PROCEDURE TRAY) ×3 IMPLANT
KIT TURNOVER KIT A (KITS) IMPLANT
LIGASURE VESSEL 5MM BLUNT TIP (ELECTROSURGICAL) ×2 IMPLANT
POUCH SPECIMEN RETRIEVAL 10MM (ENDOMECHANICALS) IMPLANT
RELOAD 45 VASCULAR/THIN (ENDOMECHANICALS) IMPLANT
RELOAD STAPLE 45 2.5 WHT GRN (ENDOMECHANICALS) IMPLANT
SCISSORS LAP 5X35 DISP (ENDOMECHANICALS) ×2 IMPLANT
SET TUBE SMOKE EVAC HIGH FLOW (TUBING) ×3 IMPLANT
SPONGE LAP 18X18 RF (DISPOSABLE) IMPLANT
STAPLER VISISTAT 35W (STAPLE) IMPLANT
SURGIFLO W/THROMBIN 8M KIT (HEMOSTASIS) IMPLANT
SUT ETHILON 3 0 PS 1 (SUTURE) IMPLANT
SUT MNCRL AB 4-0 PS2 18 (SUTURE) IMPLANT
SUT PDS AB 1 TP1 96 (SUTURE) IMPLANT
SUT VIC AB 2-0 SH 27 (SUTURE)
SUT VIC AB 2-0 SH 27X BRD (SUTURE) IMPLANT
SUT VICRYL 0 UR6 27IN ABS (SUTURE) IMPLANT
SYS LAPSCP GELPORT 120MM (MISCELLANEOUS) ×3
SYSTEM LAPSCP GELPORT 120MM (MISCELLANEOUS) ×1 IMPLANT
TOWEL OR 17X26 10 PK STRL BLUE (TOWEL DISPOSABLE) ×6 IMPLANT
TRAY FOLEY MTR SLVR 16FR STAT (SET/KITS/TRAYS/PACK) ×3 IMPLANT
TRAY LAPAROSCOPIC (CUSTOM PROCEDURE TRAY) ×3 IMPLANT
TROCAR BLADELESS OPT 5 100 (ENDOMECHANICALS) IMPLANT
TROCAR UNIVERSAL OPT 12M 100M (ENDOMECHANICALS) ×5 IMPLANT
TROCAR XCEL 12X100 BLDLESS (ENDOMECHANICALS) ×3 IMPLANT
YANKAUER SUCT BULB TIP 10FT TU (MISCELLANEOUS) ×3 IMPLANT

## 2018-07-11 NOTE — Anesthesia Procedure Notes (Signed)
Procedure Name: Intubation Date/Time: 07/11/2018 8:38 AM Performed by: Gwyndolyn Saxon, CRNA Pre-anesthesia Checklist: Patient identified, Emergency Drugs available, Suction available and Patient being monitored Patient Re-evaluated:Patient Re-evaluated prior to induction Oxygen Delivery Method: Circle system utilized Preoxygenation: Pre-oxygenation with 100% oxygen Induction Type: IV induction Ventilation: Mask ventilation without difficulty and Oral airway inserted - appropriate to patient size Laryngoscope Size: 3 Grade View: Grade I Tube type: Oral Tube size: 7.5 mm Number of attempts: 1 Airway Equipment and Method: Patient positioned with wedge pillow and Stylet Placement Confirmation: ETT inserted through vocal cords under direct vision,  positive ETCO2 and breath sounds checked- equal and bilateral Secured at: 22 cm Tube secured with: Tape Dental Injury: Teeth and Oropharynx as per pre-operative assessment

## 2018-07-11 NOTE — Progress Notes (Signed)
Had great difficulty getting patient's temperature when he arrived to unit.  Tried multiple thermometers but unable to get a reading.  Rectally patient's temperature was 96.6F.  Patient is alert and oriented at his baseline to person and place.  Warm blankets placed on patient and now temperature reads 97.3 orally.  Will continue to monitor patient closely.  Patient currently having dinner in bed, resting comfortably.  Son has been updated and called x2 for patient.

## 2018-07-11 NOTE — Anesthesia Postprocedure Evaluation (Signed)
Anesthesia Post Note  Patient: Alwaleed Obeso  Procedure(s) Performed: LEFT HAND ASSISTED LAPAROSCOPIC RADICAL NEPHRECTOMY (Left Abdomen)     Patient location during evaluation: PACU Anesthesia Type: General Level of consciousness: awake and alert Pain management: pain level controlled Vital Signs Assessment: post-procedure vital signs reviewed and stable Respiratory status: spontaneous breathing, nonlabored ventilation, respiratory function stable and patient connected to nasal cannula oxygen Cardiovascular status: blood pressure returned to baseline and stable Postop Assessment: no apparent nausea or vomiting Anesthetic complications: no    Last Vitals:  Vitals:   07/11/18 1100 07/11/18 1115  BP: (!) 155/98 (!) 143/76  Pulse: 61 64  Resp: 11 14  Temp:    SpO2: 100% 100%    Last Pain:  Vitals:   07/11/18 1052  TempSrc:   PainSc: Asleep                 Brendin Situ A.

## 2018-07-11 NOTE — Transfer of Care (Signed)
Immediate Anesthesia Transfer of Care Note  Patient: [name]  Procedure(s) Performed: LEFT HAND ASSISTED LAPAROSCOPIC RADICAL NEPHRECTOMY (Left Abdomen)  Patient Location: PACU  Anesthesia Type:General  Level of Consciousness: drowsy  Airway & Oxygen Therapy: Patient Spontanous Breathing and Patient connected to face mask oxygen  Post-op Assessment: Report given to RN and Post -op Vital signs reviewed and stable  Post vital signs: Reviewed and stable  Last Vitals:  Vitals Value Taken Time  BP 152/73 07/11/18 1052  Temp    Pulse 66 07/11/18 1055  Resp 17 07/11/18 1055  SpO2 100 % 07/11/18 1055  Vitals shown include unvalidated device data.  Last Pain:  Vitals:   07/11/18 0702  TempSrc: Oral      Patients Stated Pain Goal: 4 (67/73/73 6681)  Complications: No apparent anesthesia complications

## 2018-07-11 NOTE — Progress Notes (Signed)
Spoke to son [name]) regarding pt's npo status, and medications that he took this am.

## 2018-07-11 NOTE — Op Note (Signed)
Operative Note  Preoperative diagnosis:  1.  Left renal mass  Postoperative diagnosis: 1.  Left renal mass  Procedure(s): 1.  Left hand assisted laparoscopic radical nephrectomy  Surgeon: Link Snuffer, MD  Assistants: Tharon Aquas an assistant was needed throughout the case further expertise in assisting with a laparoscopic surgery, including visualization with the camera, passing instruments, etc.  Anesthesia: General  Complications: None  EBL:  100 cc  Specimens: 1.  Left kidney  Drains/Catheters: 1. Foley catheter  Intraoperative findings: kidney removed entirely  Indication: 78 year old male who was found on imaging to have a left renal mass concerning for renal cell carcinoma.  After discussion of different options, the patient elected to undergo the above operation.  Description of procedure:  The patient was identified and consent was obtained.  The patient was taken to the operating room and placed in the supine position.  The patient was placed under general anesthesia.  Perioperative antibiotics were administered.  The patient was placed in left lateral position at approximately 65 degrees and all pressure points were padded.  Patient was prepped and draped in a standard sterile fashion and a timeout was performed.  An 8 cm periumbilical incision was made sharply into the skin.  This was carried down with Bovie electrocautery down to the anterior rectus sheath which was divided with electrocautery.  The underlying musculature was separated in the midline.  Sharp dissection with Metzenbaum scissors was used to open up the posterior sheath and peritoneum.  This was extended with electrocautery taking great care not to use cautery near the bowel.  The hand assist port was secured into the incision.  I made sure no bowel was trapped within this.  A 12 mm port was inserted through the hand assist port and the abdomen was insufflated to a pressure of 15.  A 12 mm port was  placed lateral as well as superior to the hand assist port, each 1 about a hand width away. Please note that all ports were placed under direct visualization with the camera.  The colon was first dissected medially by incising along the white line of Toldt.  After medializing the colon, the kidney was dissected laterally and medially as well as superiorly.  Inferior attachments as well as the ureter and gonadal vein were divided with LigaSure device. The ureter was opened and the left ureteral stent removed then the ureter was clipped with a clip.I continued to carefully dissect medially and identified the renal hilum.  The renal vein and renal artery were divided with a 45 mm vascular staple load.  Superior attachments were then released using LigaSure device as well as blunt dissection.  Once the entire kidney and surrounding Gerota's fascia was freed, the specimen was withdrawn from the midline incision and passed off for permanent specimen.  The abdomen was reinspected and no active bleeding was noted.  Surgicel was applied to the nephrectomy bed.  The midline fascia was closed with running 0 looped PDS suture followed by staples.  The port incisions were closed with staples.  Exparel was instilled for anesthetic effect.  A dressing was applied.  Patient tolerated the procedure well and was stable postoperatively.  Plan: Stat labs will be obtained.  Anticipate the patient will be in the hospital 1-2 nights as long as he does well.

## 2018-07-11 NOTE — H&P (Signed)
CC/HPI: Cc: Gross hematuria, left renal mass.  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 He started having gross hematuria several days ago. He is on Brillenta for a history of cardiovascular disease. He had a balloon angioplasty of his heart back in January. He was told to stop his blood thinner yesterday. His last dose was yesterday. He underwent a CT hematuria protocol yesterday. This revealed a large amount of clot in his bladder with evidence of retention. 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 His son provides most of the history given his history of dementia.   05/23/2018  Patient returns after undergoing a cystoscopy, clot evacuation, bladder biopsy. He is doing well. Urine has been clear for the most part. Pathology of the bladder was benign. Given his blood thinner status, I decided not to perform a ureteroscopy with biopsy and decided to instead wait until blood thinner, had worn off. I would also like to obtain cardiac clearance before performing more invasive procedures.   05/30/2018: Failed trial of void at last office visit. Catheter replaced and patient started on tamsulosin. Unfortunately he had hypotension with associated lightheadedness and dizziness with the alpha blocker therapy and after discussion with PCP patient held blood pressure medication as well as tamsulosin. He remains off both of those medications at this time.   06/20/2018  Patient status post diagnostic ureteroscopy. This did not reveal any renal masses. 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 He has had an extensive discussion with his family and they have decided that they want to proceed with left radical nephrectomy.     ALLERGIES: None   MEDICATIONS: Metoprolol Tartrate  Nexium  Zyrtec  Alfuzosin Hcl Er 10 mg tablet, extended release 24 hr 1 tablet PO Q HS  Aricept  Aspir 81  Lasix  Namenda     GU PSH: Cystoscopy Irrigate Clot -  05/18/2018 Cystoscopy TURBT <2 cm - 05/18/2018    NON-GU PSH: None   GU PMH: Gross hematuria - 3/38/2505 Left uncertain neoplasm of kidney - 05/18/2018 Urinary Retention - 05/18/2018    NON-GU PMH: GERD Heart disease, unspecified Hypercholesterolemia Hypertension Myocardial Infarction    FAMILY HISTORY: 1 son - Other Kidney Stones - Son stroke - Father, Mother   SOCIAL HISTORY: Marital Status: Single Preferred Language: English; Race: White Current Smoking Status: Patient does not smoke anymore. Has not smoked since 05/04/1983.   Tobacco Use Assessment Completed: Used Tobacco in last 30 days? Drinks 4+ caffeinated drinks per day.    REVIEW OF SYSTEMS:    GU Review Male:   Patient denies frequent urination, hard to postpone urination, burning/ pain with urination, get up at night to urinate, leakage of urine, stream starts and stops, trouble starting your stream, have to strain to urinate , erection problems, and penile pain.  Gastrointestinal (Upper):   Patient denies nausea, vomiting, and indigestion/ heartburn.  Gastrointestinal (Lower):   Patient denies diarrhea and constipation.  Constitutional:   Patient denies night sweats, weight loss, fatigue, and fever.  Skin:   Patient denies skin rash/ lesion and itching.  Eyes:   Patient denies blurred vision and double vision.  Ears/ Nose/ Throat:   Patient denies sore throat and sinus problems.  Hematologic/Lymphatic:   Patient denies swollen glands and easy bruising.  Cardiovascular:   Patient denies leg swelling and chest pains.  Respiratory:   Patient denies cough and shortness of breath.  Endocrine:   Patient denies excessive thirst.  Musculoskeletal:   Patient denies back pain and joint pain.  Neurological:   Patient denies headaches and dizziness.  Psychologic:   Patient denies depression and anxiety.   VITAL SIGNS:      06/20/2018 03:01 PM  BP 147/96 mmHg  Heart Rate 56 /min  Temperature 97.9 F / 36.6 C    MULTI-SYSTEM PHYSICAL EXAMINATION:    Constitutional: Well-nourished. No physical deformities. Normally developed. Good grooming.  Respiratory: No labored breathing, no use of accessory muscles.   Cardiovascular: Normal temperature, adequate perfusion of extremities  Skin: No paleness, no jaundice  Neurologic / Psychiatric: Moderately verbal, follows commands.  Gastrointestinal: No mass, no tenderness, no rigidity, non obese abdomen.  Eyes: Normal conjunctivae. Normal eyelids.  Musculoskeletal: Normal gait and station of head and neck.     PAST DATA REVIEWED:  Source Of History:  Patient  Records Review:   Previous Patient Records   PROCEDURES:          Urinalysis w/Scope Dipstick Dipstick Cont'd Micro  Color: Orange Bilirubin: Neg mg/dL WBC/hpf: 40 - 60/hpf  Appearance: Cloudy Ketones: Neg mg/dL RBC/hpf: 40 - 60/hpf  Specific Gravity: 1.025 Blood: 3+ ery/uL Bacteria: Many (>50/hpf)  pH: 6.0 Protein: 3+ mg/dL Cystals: NS (Not Seen)  Glucose: Neg mg/dL Urobilinogen: 1.0 mg/dL Casts: NS (Not Seen)    Nitrites: Neg Trichomonas: Not Present    Leukocyte Esterase: 3+ leu/uL Mucous: Not Present      Epithelial Cells: 0 - 5/hpf      Yeast: NS (Not Seen)      Sperm: Not Present    Notes: Microscopic not concentrated.    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            Document Letter(s):  Created for Patient: Clinical Summary         Notes:   I had an extensive discussion with the patient as well as his family. We discussed his overall health status, including his history of cardiovascular disease and recent myocardial infarction. He had a balloon angioplasty but did not have a stent placed. He may stay on 81 mg aspirin for the surgery, which will be a left hand-assisted  laparoscopic radical nephrectomy. I would like to obtain cardiology clearance. I feel he is high risk for the surgery and I discussed this with him as well as his family. There is a possibility of having a major myocardial infarction. They could result in death.   He is also seeing vascular surgery for his large AAA. It was felt that the renal mass should be taken care of first. I did discuss the increased stress, surgery can have. This may increase his risk of AAA rupture, which would likely result in death as well.   After full informed discussion, he would like to proceed with nephrectomy.   CC: Claretta Fraise, M.D.   Signed by Link Snuffer, III, M.D. on 06/20/18 at 3:20 PM (EDT

## 2018-07-11 NOTE — Anesthesia Procedure Notes (Signed)
Arterial Line Insertion Start/End7/08/2018 8:05 AM, 07/11/2018 8:10 AM Performed by: Josephine Igo, MD, Gwyndolyn Saxon, CRNA, CRNA  Patient location: Pre-op. Preanesthetic checklist: patient identified, IV checked, site marked, risks and benefits discussed, surgical consent, monitors and equipment checked, pre-op evaluation, timeout performed and anesthesia consent Lidocaine 1% used for infiltration Right, radial was placed Catheter size: 20 G Hand hygiene performed , maximum sterile barriers used  and Seldinger technique used Allen's test indicative of satisfactory collateral circulation Attempts: 1 Procedure performed without using ultrasound guided technique. Following insertion, dressing applied and Biopatch. Post procedure assessment: normal  Patient tolerated the procedure well with no immediate complications.

## 2018-07-12 ENCOUNTER — Encounter (HOSPITAL_COMMUNITY): Payer: Self-pay | Admitting: Urology

## 2018-07-12 LAB — BASIC METABOLIC PANEL
Anion gap: 8 (ref 5–15)
BUN: 23 mg/dL (ref 8–23)
CO2: 25 mmol/L (ref 22–32)
Calcium: 8.8 mg/dL — ABNORMAL LOW (ref 8.9–10.3)
Chloride: 104 mmol/L (ref 98–111)
Creatinine, Ser: 1.3 mg/dL — ABNORMAL HIGH (ref 0.61–1.24)
GFR calc Af Amer: >60 mL/min (ref >60)
GFR calc non Af Amer: 53 mL/min — ABNORMAL LOW (ref >60)
Glucose, Bld: 146 mg/dL — ABNORMAL HIGH (ref 70–99)
Potassium: 4.6 mmol/L (ref 3.5–5.1)
Sodium: 137 mmol/L (ref 135–145)

## 2018-07-12 LAB — HEMOGLOBIN AND HEMATOCRIT, BLOOD
HCT: 28.9 % — ABNORMAL LOW (ref 39.0–52.0)
Hemoglobin: 9 g/dL — ABNORMAL LOW (ref 13.0–17.0)

## 2018-07-12 MED ORDER — ALFUZOSIN HCL ER 10 MG PO TB24
10.0000 mg | ORAL_TABLET | Freq: Every day | ORAL | Status: DC
  Administered 2018-07-13: 10 mg via ORAL
  Filled 2018-07-12: qty 1

## 2018-07-12 MED ORDER — PANTOPRAZOLE SODIUM 40 MG PO TBEC
40.0000 mg | DELAYED_RELEASE_TABLET | Freq: Every day | ORAL | Status: DC
  Administered 2018-07-12 – 2018-07-13 (×2): 40 mg via ORAL
  Filled 2018-07-12 (×2): qty 1

## 2018-07-12 MED ORDER — LORATADINE 10 MG PO TABS
10.0000 mg | ORAL_TABLET | Freq: Every day | ORAL | Status: DC
  Administered 2018-07-12 – 2018-07-13 (×2): 10 mg via ORAL
  Filled 2018-07-12 (×2): qty 1

## 2018-07-12 MED ORDER — MEMANTINE HCL 10 MG PO TABS
10.0000 mg | ORAL_TABLET | Freq: Every day | ORAL | Status: DC
  Administered 2018-07-12 – 2018-07-13 (×2): 10 mg via ORAL
  Filled 2018-07-12 (×2): qty 1

## 2018-07-12 MED ORDER — HEPARIN SODIUM (PORCINE) 5000 UNIT/ML IJ SOLN
5000.0000 [IU] | Freq: Three times a day (TID) | INTRAMUSCULAR | Status: DC
  Administered 2018-07-12: 5000 [IU] via SUBCUTANEOUS
  Filled 2018-07-12 (×3): qty 1

## 2018-07-12 MED ORDER — METOPROLOL SUCCINATE ER 25 MG PO TB24
25.0000 mg | ORAL_TABLET | Freq: Every day | ORAL | Status: DC
  Administered 2018-07-12 – 2018-07-13 (×2): 25 mg via ORAL
  Filled 2018-07-12 (×2): qty 1

## 2018-07-12 MED ORDER — ATORVASTATIN CALCIUM 40 MG PO TABS
80.0000 mg | ORAL_TABLET | Freq: Every day | ORAL | Status: DC
  Administered 2018-07-13: 03:00:00 80 mg via ORAL
  Filled 2018-07-12: qty 2

## 2018-07-12 MED ORDER — DONEPEZIL HCL 10 MG PO TABS
10.0000 mg | ORAL_TABLET | Freq: Every day | ORAL | Status: DC
  Administered 2018-07-13: 10 mg via ORAL
  Filled 2018-07-12: qty 1

## 2018-07-12 NOTE — Progress Notes (Signed)
Urology Progress Note   78 year old male with history of dementia and large left renal mass.  Status post left open hand-assisted nephrectomy on 7/8.  1 Day Post-Op  Subjective: Did well overnight.  Tolerated minimal p.o. intake.  Out of bed to chair this morning.  No major complaints of pain.  Expected rise in creatinine this morning  Objective: Vital signs in last 24 hours: Temp:  [96.6 F (35.9 C)-98.7 F (37.1 C)] 97.4 F (36.3 C) (07/09 0600) Pulse Rate:  [52-101] 80 (07/09 0600) Resp:  [11-20] 18 (07/09 0600) BP: (110-160)/(72-98) 120/76 (07/09 0600) SpO2:  [95 %-100 %] 96 % (07/09 0600) Arterial Line BP: (141-155)/(61-74) 148/65 (07/08 1200)  Intake/Output from previous day: 07/08 0701 - 07/09 0700 In: 2222.7 [I.V.:1972.7; IV Piggyback:250] Out: 435 [Urine:385; Blood:50] Intake/Output this shift: No intake/output data recorded.  Physical Exam:  General: Alert, confused but interactive and appropriate Abdomen: Soft, appropriately tender.  Midline incision with minimal drainage.  No distention GU: Foley in place draining clear yellow urine  Ext: NT, No erythema  Lab Results: Recent Labs    07/11/18 1114 07/12/18 0452  HGB 9.7* 9.0*  HCT 31.2* 28.9*   BMET Recent Labs    07/11/18 1114 07/12/18 0452  NA 140 137  K 3.7 4.6  CL 107 104  CO2 25 25  GLUCOSE 152* 146*  BUN 18 23  CREATININE 1.00 1.30*  CALCIUM 8.9 8.8*     Studies/Results: No results found.  Assessment/Plan:  78 y.o. male s/p left hand-assisted nephrectomy.  Overall doing well post-op.   -Physical therapy evaluate and treat -DC fluids -Start subq heparin - Increase diet to full liquid - Discontinue Foley catheter -Restart home meds -Still needs anticoagulation plan at discharge  Dispo: Patient will stay today, possible discharge on 7/10 with home health   LOS: 1 day   Tharon Aquas 07/12/2018, 8:03 AM

## 2018-07-12 NOTE — Progress Notes (Signed)
RN and NT staff regularly entering room when bed or chair alarms go off.  Staff continues to reorient patient d/t confusion about where he is or requests to go home.

## 2018-07-12 NOTE — Progress Notes (Signed)
Ambulated with patient in hallway as he used front-wheel walker.  Patient walked 1//4 of hallway and back to room.  No signs of distress, though RN needed to reorient patient regarding where he was and why he is here.

## 2018-07-13 LAB — BASIC METABOLIC PANEL
Anion gap: 8 (ref 5–15)
BUN: 24 mg/dL — ABNORMAL HIGH (ref 8–23)
CO2: 27 mmol/L (ref 22–32)
Calcium: 8.7 mg/dL — ABNORMAL LOW (ref 8.9–10.3)
Chloride: 99 mmol/L (ref 98–111)
Creatinine, Ser: 1.33 mg/dL — ABNORMAL HIGH (ref 0.61–1.24)
GFR calc Af Amer: >60 mL/min (ref >60)
GFR calc non Af Amer: 52 mL/min — ABNORMAL LOW (ref >60)
Glucose, Bld: 96 mg/dL (ref 70–99)
Potassium: 3.9 mmol/L (ref 3.5–5.1)
Sodium: 134 mmol/L — ABNORMAL LOW (ref 135–145)

## 2018-07-13 LAB — HEMOGLOBIN AND HEMATOCRIT, BLOOD
HCT: 27 % — ABNORMAL LOW (ref 39.0–52.0)
HCT: 27.4 % — ABNORMAL LOW (ref 39.0–52.0)
Hemoglobin: 8.6 g/dL — ABNORMAL LOW (ref 13.0–17.0)
Hemoglobin: 8.6 g/dL — ABNORMAL LOW (ref 13.0–17.0)

## 2018-07-13 MED ORDER — ASPIRIN 81 MG PO CHEW
81.0000 mg | CHEWABLE_TABLET | Freq: Every day | ORAL | Status: DC
  Administered 2018-07-13: 11:00:00 81 mg via ORAL
  Filled 2018-07-13: qty 1

## 2018-07-13 MED ORDER — HYDROCODONE-ACETAMINOPHEN 5-325 MG PO TABS: 1.0000 | ORAL_TABLET | ORAL | 0 refills | Status: DC | PRN

## 2018-07-13 NOTE — Progress Notes (Signed)
Urology Progress Note   78 year old male with history of dementia and large left renal mass.  Status post left open hand-assisted nephrectomy on 7/8.  2 Days Post-Op  Subjective: Past trial of void.  Tolerating p.o. but minimal intake.  Delirious last night.  Dressing removed this morning with some irritation around the hand at the site.  Sore but pain controlled.  Ambulated with a walker yesterday.  Unsteady on his feet per nursing.  Still needs PT eval  Objective: Vital signs in last 24 hours: Temp:  [97.8 F (36.6 C)-98.5 F (36.9 C)] 97.8 F (36.6 C) (07/10 8295) Pulse Rate:  [63-72] 71 (07/10 0642) Resp:  [19-20] 20 (07/10 0642) BP: (92-152)/(66-89) 92/66 (07/10 0642) SpO2:  [97 %-100 %] 97 % (07/10 0642)  Intake/Output from previous day: 07/09 0701 - 07/10 0700 In: 240 [P.O.:240] Out: 800 [Urine:800] Intake/Output this shift: No intake/output data recorded.  Physical Exam:  General: Alert, confused but interactive and appropriate Abdomen: Soft, appropriately tender.  Midline incision with brusing inferiouly but no breakdown. GU: voiding spontaneously Ext: NT, No erythema  Lab Results: Recent Labs    07/11/18 1114 07/12/18 0452 07/13/18 0429  HGB 9.7* 9.0* 8.6*  HCT 31.2* 28.9* 27.0*   BMET Recent Labs    07/12/18 0452 07/13/18 0429  NA 137 134*  K 4.6 3.9  CL 104 99  CO2 25 27  GLUCOSE 146* 96  BUN 23 24*  CREATININE 1.30* 1.33*  CALCIUM 8.8* 8.7*     Studies/Results: No results found.  Assessment/Plan:  78 y.o. male s/p left hand-assisted nephrectomy.  Overall doing well post-op.   -Physical therapy evaluate and treat. Possible d/c 7/10 - Start ASA - Continue subq heparin - Regular diet -Still needs anticoagulation plan (Brillanta) at discharge  Dispo: , possible discharge on 7/10 with home health   LOS: 2 days   Tharon Aquas 07/13/2018, 7:32 AM

## 2018-07-13 NOTE — Discharge Instructions (Signed)
1. Activity:  You are encouraged to ambulate frequently (about every hour during waking hours) to help prevent blood clots from forming in your legs or lungs.  However, you should not engage in any heavy lifting (> 10-15 lbs), strenuous activity, or straining.  2. Diet: You should continue a clear liquid diet until passing gas from below.  Once this occurs, you may advance your diet to a soft diet that would be easy to digest (i.e soups, scrambled eggs, mashed potatoes, etc.) for 24 hours just as you would if getting over a bad stomach flu.  If tolerating this diet well for 24 hours, you may then begin eating regular food.  It will be normal to have some amount of bloating, nausea, and abdominal discomfort intermittently.  3. Prescriptions:  You will be provided a prescription for pain medication to take as needed.  If your pain is not severe enough to require the prescription pain medication, you may take extra strength Tylenol instead.  You should also take an over the counter stool softener (Colace 100 mg twice daily) to avoid straining with bowel movements as the pain medication may constipate you. Finally, you will also be provided a prescription for an antibiotic to begin the day prior to your return visit in the office for catheter removal.  4. Incisions:  You most likely will have a few small staples in each of the incisions and once the bandages are removed, the incisions may stay open to air.  You may start showering (not soaking or bathing in water) 48 hours after surgery and the incisions simply need to be patted dry after the shower.  No additional care is needed.  5. What to call us about: You should call the office [phone]) if you develop fever > 101, persistent vomiting, or the catheter stops draining. Also, feel free to call with any other questions you may have and remember the handout that was provided to you as a reference preoperatively which answers many of the common questions  that arise after surgery.  MEDICATIONS: Continue Asprin. Can restart BRILLANTA on 07/16/18

## 2018-07-13 NOTE — Care Management Important Message (Signed)
Important Message  Patient Details IM Letter given to Marney Doctor RN to present to the Patient Name: [name] MRN: [id number] Date of Birth: Feb 07, 1940   Medicare Important Message Given:  Yes     Kerin Salen 07/13/2018, 12:54 PM

## 2018-07-13 NOTE — Plan of Care (Signed)
  Problem: Education: Goal: Knowledge of the prescribed therapeutic regimen will improve Outcome: Adequate for Discharge   Problem: Bowel/Gastric: Goal: Gastrointestinal status for postoperative course will improve Outcome: Adequate for Discharge   Problem: Clinical Measurements: Goal: Postoperative complications will be avoided or minimized Outcome: Adequate for Discharge   Problem: Respiratory: Goal: Ability to achieve and maintain a regular respiratory rate will improve Outcome: Adequate for Discharge   Problem: Skin Integrity: Goal: Demonstration of wound healing without infection will improve Outcome: Adequate for Discharge   Problem: Urinary Elimination: Goal: Ability to avoid or minimize complications of infection will improve Outcome: Adequate for Discharge Goal: Ability to achieve and maintain urine output will improve Outcome: Adequate for Discharge   Problem: Education: Goal: Knowledge of General Education information will improve Description: Including pain rating scale, medication(s)/side effects and non-pharmacologic comfort measures Outcome: Adequate for Discharge   Problem: Health Behavior/Discharge Planning: Goal: Ability to manage health-related needs will improve Outcome: Adequate for Discharge   Problem: Clinical Measurements: Goal: Ability to maintain clinical measurements within normal limits will improve Outcome: Adequate for Discharge Goal: Will remain free from infection Outcome: Adequate for Discharge Goal: Diagnostic test results will improve Outcome: Adequate for Discharge Goal: Respiratory complications will improve Outcome: Adequate for Discharge Goal: Cardiovascular complication will be avoided Outcome: Adequate for Discharge   Problem: Activity: Goal: Risk for activity intolerance will decrease Outcome: Adequate for Discharge   Problem: Nutrition: Goal: Adequate nutrition will be maintained Outcome: Adequate for Discharge    Problem: Elimination: Goal: Will not experience complications related to bowel motility Outcome: Adequate for Discharge Goal: Will not experience complications related to urinary retention Outcome: Adequate for Discharge   Problem: Pain Managment: Goal: General experience of comfort will improve Outcome: Adequate for Discharge   Problem: Safety: Goal: Ability to remain free from injury will improve Outcome: Adequate for Discharge   Problem: Skin Integrity: Goal: Risk for impaired skin integrity will decrease Outcome: Adequate for Discharge

## 2018-07-13 NOTE — Discharge Summary (Signed)
[facility] Discharge Summary  Admit date: 07/11/2018  Discharge date and time: 07/13/18   Discharge to: Home  Discharge Service: Urology  Discharge Attending Physician: Dr. Link Snuffer  Discharge  Diagnoses: Renal mass  OR Procedures: Procedure(s): LEFT HAND ASSISTED LAPAROSCOPIC RADICAL NEPHRECTOMY 07/11/2018   Ancillary Procedures: None   Discharge Day Services: The patient was seen and examined by the Urology team both in the morning and immediately prior to discharge.  Vital signs and laboratory values were stable and within normal limits.  The physical exam was benign and unchanged and all surgical wounds were examined.  Discharge instructions were explained and all questions answered.  Physical therapy evaluated the patient and he had no outpatient needs  Subjective  No acute events overnight. Pain Controlled. No fever or chills.  Ambulated today.  Tolerating p.o.  Objective Patient Vitals for the past 8 hrs:  BP Temp Temp src Pulse Resp SpO2  07/13/18 1239 119/73 98.2 F (36.8 C) Oral 73 (!) 21 97 %   No intake/output data recorded.  General Appearance:        No acute distress Lungs:                       Normal work of breathing on room air Heart:                                Regular rate and rhythm Abdomen:                         Soft, non-tender, non-distended.  Some bruising around hand-assisted incision and skin irritation from dressing tape  Extremities:                      Warm and well perfused   Hospital Course:  The patient underwent left hand and assist nephrectomy on 07/11/2018.  The patient tolerated the procedure well, was extubated in the OR, and afterwards was taken to the PACU for routine post-surgical care. When stable the patient was transferred to the floor.     The patient did well postoperatively.  He stayed in the hospital for 2 days due to slow increase in p.o. intake and ambulation.  Had delirium in the hospital but by  postoperative day  2 he was more oriented.  Passed a trial void on postoperative day 1.   The patient was discharged home 2 Days Post-Op, at which point was tolerating a regular solid diet, was able to void spontaneously, have adequate pain control with P.O. pain medication, and could ambulate without difficulty (was evaluated by physical therapy and deemed to have no needs upon discharge).   The patient will follow up with [place] for post op check which is already scheduled in the urology electronic medical record system.  Pathology was not discussed prior to discharge  Aspirin was resumed prior to discharge and plan to start Brilinta in 3 days.  Condition at Discharge: Improved  Discharge Medications:  Allergies as of 07/13/2018      Reactions   Tamsulosin    Dropped bp too low      Medication List    TAKE these medications   alfuzosin 10 MG 24 hr tablet Commonly known as: UROXATRAL Take 10 mg by mouth at bedtime.   aspirin 81 MG chewable tablet Chew 1 tablet (81 mg total) by mouth daily.  atorvastatin 80 MG tablet Commonly known as: LIPITOR Take 1 tablet (80 mg total) by mouth daily at 6 PM. What changed: when to take this   cetirizine 10 MG tablet Commonly known as: ZYRTEC Take 10 mg by mouth daily.   donepezil 10 MG tablet Commonly known as: ARICEPT TAKE ONE TABLET BY MOUTH AT BEDTIME.   esomeprazole 20 MG capsule Commonly known as: NEXIUM Take 20 mg by mouth daily.   furosemide 20 MG tablet Commonly known as: LASIX Take 20 mg by mouth daily as needed for fluid.   HYDROcodone-acetaminophen 5-325 MG tablet Commonly known as: Norco Take 1 tablet by mouth every 4 (four) hours as needed for moderate pain. What changed: Another medication with the same name was added. Make sure you understand how and when to take each.   HYDROcodone-acetaminophen 5-325 MG tablet Commonly known as: Norco Take 1 tablet by mouth every 4 (four) hours as needed for moderate pain. What changed: You were  already taking a medication with the same name, and this prescription was added. Make sure you understand how and when to take each.   memantine 10 MG tablet Commonly known as: NAMENDA TAKE ONE TABLET BY MOUTH DAILY.   metoprolol succinate 25 MG 24 hr tablet Commonly known as: TOPROL-XL Take 1 tablet (25 mg total) by mouth daily.   nitroGLYCERIN 0.4 MG SL tablet Commonly known as: Nitrostat Place 1 tablet (0.4 mg total) under the tongue every 5 (five) minutes as needed. What changed: reasons to take this

## 2018-07-13 NOTE — Progress Notes (Signed)
NT assisted patient in dressing for discharge.  Patient stable.    RN took patient to front entrance to meet son and family.  RN reviewed discharge paperwork with son who stated he understood the information.  Son helped patient into the waiting vehicle.

## 2018-07-13 NOTE — Evaluation (Signed)
Physical Therapy One Time Evaluation Patient Details Name: [name] MRN: [id number] DOB: [birth year] Today's Date: 07/13/2018   History of Present Illness  78 year old male with history of dementia, STEMI s/p PTCA, and large left renal mass.  Pt currently status post left open hand-assisted nephrectomy on 7/8.  Clinical Impression  Patient evaluated by Physical Therapy with no further acute PT needs identified. All education has been completed and the patient has no further questions.  Pt eager to get OOB and ambulated in hallway.  Pt likely close to his mobility baseline.  Recommend initial supervision upon d/c for safety (only due to dementia). See below for any follow-up Physical Therapy or equipment needs. PT is signing off. Thank you for this referral.     Follow Up Recommendations No PT follow up;Supervision for mobility/OOB    Equipment Recommendations  None recommended by PT    Recommendations for Other Services       Precautions / Restrictions Precautions Precautions: Fall      Mobility  Bed Mobility Overal bed mobility: Needs Assistance Bed Mobility: Supine to Sit     Supine to sit: Supervision     General bed mobility comments: supervision for safety, increased time  Transfers Overall transfer level: Needs assistance Equipment used: None Transfers: Sit to/from Stand Sit to Stand: Min guard         General transfer comment: verbal cues for hand placement  Ambulation/Gait Ambulation/Gait assistance: Min guard Gait Distance (Feet): 160 Feet Assistive device: None;Rolling walker (2 wheeled) Gait Pattern/deviations: Step-through pattern;Decreased stride length     General Gait Details: first 80 feet utilized RW however pt lifting back end of RW so performed 80 feet without assistive device, min/guard for safety, no overt LOB  Stairs            Wheelchair Mobility    Modified Rankin (Stroke Patients Only)       Balance Overall balance  assessment: Needs assistance         Standing balance support: No upper extremity supported Standing balance-Leahy Scale: Good                               Pertinent Vitals/Pain Pain Assessment: Faces Faces Pain Scale: No hurt Pain Intervention(s): Monitored during session;Repositioned    Home Living Family/patient expects to be discharged to:: Private residence Living Arrangements: Alone Available Help at Discharge: Family;Available 24 hours/day Type of Home: House Home Access: Stairs to enter Entrance Stairs-Rails: None Entrance Stairs-Number of Steps: 1 Home Layout: Two level;Able to live on main level with bedroom/bathroom Home Equipment: None Additional Comments: above per admission 01/2018, pt poor historian    Prior Function Level of Independence: Independent         Comments: Pt's son reports pt spends the day at pt's sons house and returns to his own at night.      Hand Dominance        Extremity/Trunk Assessment        Lower Extremity Assessment Lower Extremity Assessment: Overall WFL for tasks assessed       Communication   Communication: No difficulties  Cognition Arousal/Alertness: Awake/alert Behavior During Therapy: WFL for tasks assessed/performed Overall Cognitive Status: History of cognitive impairments - at baseline  General Comments      Exercises     Assessment/Plan    PT Assessment Patent does not need any further PT services  PT Problem List         PT Treatment Interventions      PT Goals (Current goals can be found in the Care Plan section)  Acute Rehab PT Goals PT Goal Formulation: All assessment and education complete, DC therapy    Frequency     Barriers to discharge        Co-evaluation               AM-PAC PT "6 Clicks" Mobility  Outcome Measure Help needed turning from your back to your side while in a flat bed without using  bedrails?: None Help needed moving from lying on your back to sitting on the side of a flat bed without using bedrails?: None Help needed moving to and from a bed to a chair (including a wheelchair)?: A Little Help needed standing up from a chair using your arms (e.g., wheelchair or bedside chair)?: A Little Help needed to walk in hospital room?: A Little Help needed climbing 3-5 steps with a railing? : A Little 6 Click Score: 20    End of Session Equipment Utilized During Treatment: Gait belt Activity Tolerance: Patient tolerated treatment well Patient left: in chair;with chair alarm set;with call bell/phone within reach Nurse Communication: Mobility status PT Visit Diagnosis: Difficulty in walking, not elsewhere classified (R26.2)    Time: 5974-1638 PT Time Calculation (min) (ACUTE ONLY): 11 min   Charges:   PT Evaluation $PT Eval Low Complexity: [place], PT, DPT Acute Rehabilitation Services Office: [phone] Pager: [phone]  Trena Platt 07/13/2018, 11:58 AM

## 2018-07-16 NOTE — Patient Outreach (Signed)
An EMMI red flag was assigned to Chong Sicilian, RN.

## 2018-07-17 ENCOUNTER — Telehealth: Payer: Medicare Other

## 2018-07-17 ENCOUNTER — Ambulatory Visit: Payer: Medicare Other | Admitting: *Deleted

## 2018-07-17 NOTE — Patient Instructions (Signed)
Visit Information  Goals Addressed            This Visit's Progress     Patient Stated   . Son: "I want my dad to feel better" (pt-stated)       Current Barriers:  . Chronic Disease Management support and education needs related to recent nephrectomy  Nurse Case Manager Clinical Goal(s):  Marland Kitchen Over the next 7 days, patient/son will verbalize understanding of plan for health improvement . Over the next 14 days, patient's energy level and general feeling of well-being will improve  Interventions:  . RNCM talked with son about his perception of his father's wellbeing o Son states that his energy level is down and that he seems to be feeling worse. Patient, on the-other-hand, states that he's feeling better.  o Son denies any hematuria or surgical site problems o Has been taking Tylenol for pain but required hydrocodone yesterday and it did not provide relief . Recommended that he call the surgeon, Dr Purvis Sheffield, office and update them on his current status  Patient Self Care Activities:  . Currently UNABLE TO independently Relies on son and daughter-in-law for assistance with most ADLs  Initial goal documentation       Other   . Care Manager Goal: Patient will take his medications as prescribed and the medicaiton list will reflect his current prescriptions       Current Barriers:  . Chronic Disease Management support and education needs related to medication directions.  Nurse Case Manager Clinical Goal(s):  Marland Kitchen Over the next 7 days, patient will work with Consulting civil engineer to address needs related to medication management  Interventions:  . Reviewed medications with patient and discussed Brilinta and ASA.  Marland Kitchen Discharge instructions and office notes reviewed o PCP d/c Brilinta in May due to gross hematuria o Dr Martinique prescribed Brilinta in January after patient had an MI o He was taking ASA as well per Dr Doug Sou instructions and Dr Martinique did not want him to d/c ASA o ASA was d/c  prior to nephrectomy but was restarted while hospitalized o D/c instructions state to restart Brilinta 3 days after discharge o Son verified that they have [place] on hand but have not restarted it . Will route this note Dr Martinique as an FYI  Patient Self Care Activities:  . Currently UNABLE TO independently has help from son and wife for most many ADLs and transportation  Initial goal documentation       The care management team will reach out to the patient again over the next 7 days.    Chong Sicilian BSN, RN-BC Embedded Chronic Care Manager [facility] [facility] Direct Dial: [phone]

## 2018-07-17 NOTE — Chronic Care Management (AMB) (Signed)
Care Management Note   [name] is a 78 y.o. year old male who is a primary care patient of Stacks, Allen Gash, MD. The CM team was consulted for assistance with chronic disease management and care coordination. [name] was recently discharged from the hospital s/p left laparoscopic radical nephrectomy on 07/11/18 to treat renal carcinoma. Discharge follow-up by EMMI indicated that [name] was unsure of his discharge medications. I was consulted to reach out to [name] to perform a medication reconcilliation and to answer any questions.   I reached out to [name] and his son by phone today and spoke to them in two separate phone calls.   Subjective [name] has dementia and lives at home alone. His son, [name], and his wife live next door. They both provide care for [name] and check in on him throughout the day. He has dinner with them daily. [name] expressed that his father seems to be getting weaker since discharge and that last night he had more difficulty walking across the yard than he normally does. He denied any gross hematuria since discharge and states that he does continue to have pain. [name] was mostly successfully managing his pain with Tylenol but yesterday he required one of the hydrocodone tablets. States that it did not seem to help the pain. [name] said that he was getting better but [name] thinks he is getting worse and stated that his dad didn't like going to the doctor so he may say he's better to avoid a visit.   Objective  Allergies as of 07/17/2018      Reactions   Tamsulosin    Dropped bp too low      Medication List       Accurate as of July 17, 2018  2:48 PM. If you have any questions, ask your nurse or doctor.        alfuzosin 10 MG 24 hr tablet Commonly known as: UROXATRAL Take 10 mg by mouth at bedtime.   aspirin 81 MG chewable tablet Chew 1 tablet (81 mg total) by mouth daily.   atorvastatin 80 MG tablet Commonly known as: LIPITOR  Take 1 tablet (80 mg total) by mouth daily at 6 PM. What changed: when to take this   cetirizine 10 MG tablet Commonly known as: ZYRTEC Take 10 mg by mouth daily.   donepezil 10 MG tablet Commonly known as: ARICEPT TAKE ONE TABLET BY MOUTH AT BEDTIME.   esomeprazole 20 MG capsule Commonly known as: NEXIUM Take 20 mg by mouth daily.   furosemide 20 MG tablet Commonly known as: LASIX Take 20 mg by mouth daily as needed for fluid.   HYDROcodone-acetaminophen 5-325 MG tablet Commonly known as: Norco Take 1 tablet by mouth every 4 (four) hours as needed for moderate pain.   HYDROcodone-acetaminophen 5-325 MG tablet Commonly known as: Norco Take 1 tablet by mouth every 4 (four) hours as needed for moderate pain.   losartan 25 MG tablet Commonly known as: COZAAR Take 25 mg by mouth daily.   memantine 10 MG tablet Commonly known as: NAMENDA TAKE ONE TABLET BY MOUTH DAILY.   metoprolol succinate 25 MG 24 hr tablet Commonly known as: TOPROL-XL Take 1 tablet (25 mg total) by mouth daily.   nitroGLYCERIN 0.4 MG SL tablet Commonly known as: Nitrostat Place 1 tablet (0.4 mg total) under the tongue every 5 (five) minutes as needed. What changed: reasons to take this   tamsulosin 0.4 MG Caps capsule Commonly known  as: FLOMAX Take 0.4 mg by mouth daily.      Discharge noted states: Aspirin was resumed prior to discharge and plan to start Brilinta in 3 days.     Medications discussed and post-discharge medication reconciliation completed.   CCM Consent Allen. Smith was given information about Chronic Care Management services today including:  1. CCM service includes personalized support from designated clinical staff supervised by his physician, including individualized plan of care and coordination with other care providers 2. 24/7 contact phone numbers for assistance for urgent and routine care needs. 3. Service will only be billed when office clinical staff spend 20 minutes  or more in a month to coordinate care. 4. Only one practitioner may furnish and bill the service in a calendar month. 5. The patient may stop CCM services at any time (effective at the end of the month) by phone call to the office staff. 6. The patient will be responsible for cost sharing (co-pay) of up to 20% of the service fee (after annual deductible is met). Patient did not agree to services and wishes to consider information provided before deciding about enrollment in care management services.  I will discuss CCM services with son, [name], during a later follow up call.     Follow Up Plan: The care management team will reach out to the patient again over the next 7 days.    Chong Sicilian BSN, RN-BC Embedded Chronic Care Manager [facility] Direct Dial: [phone]

## 2018-07-18 ENCOUNTER — Telehealth: Payer: Self-pay

## 2018-07-18 NOTE — Telephone Encounter (Signed)
Spoke to patient's son [name] Dr.Jordan advised father should resume Brilinta 90 mg twice a day to complete 1 year from stent.Advised he should resume Aspirin 81 mg daily indefinitely.

## 2018-07-30 DIAGNOSIS — C642 Malignant neoplasm of left kidney, except renal pelvis: Secondary | ICD-10-CM | POA: Diagnosis not present

## 2018-07-31 ENCOUNTER — Other Ambulatory Visit: Payer: Self-pay | Admitting: Physician Assistant

## 2018-08-12 ENCOUNTER — Encounter (HOSPITAL_COMMUNITY): Payer: Self-pay | Admitting: Emergency Medicine

## 2018-08-12 ENCOUNTER — Emergency Department (HOSPITAL_COMMUNITY): Payer: Medicare Other

## 2018-08-12 ENCOUNTER — Other Ambulatory Visit: Payer: Self-pay

## 2018-08-12 ENCOUNTER — Observation Stay (HOSPITAL_COMMUNITY)
Admission: EM | Admit: 2018-08-12 | Discharge: 2018-08-13 | Disposition: A | Discharge status: Home / Self Care | Payer: Medicare Other | Attending: Internal Medicine | Admitting: Internal Medicine

## 2018-08-12 DIAGNOSIS — Q441 Other congenital malformations of gallbladder: Secondary | ICD-10-CM | POA: Diagnosis not present

## 2018-08-12 DIAGNOSIS — R1011 Right upper quadrant pain: Secondary | ICD-10-CM

## 2018-08-12 DIAGNOSIS — R0689 Other abnormalities of breathing: Secondary | ICD-10-CM | POA: Diagnosis not present

## 2018-08-12 DIAGNOSIS — K8020 Calculus of gallbladder without cholecystitis without obstruction: Secondary | ICD-10-CM

## 2018-08-12 DIAGNOSIS — K8070 Calculus of gallbladder and bile duct without cholecystitis without obstruction: Secondary | ICD-10-CM | POA: Insufficient documentation

## 2018-08-12 DIAGNOSIS — J90 Pleural effusion, not elsewhere classified: Secondary | ICD-10-CM | POA: Diagnosis not present

## 2018-08-12 DIAGNOSIS — Z209 Contact with and (suspected) exposure to unspecified communicable disease: Secondary | ICD-10-CM | POA: Diagnosis not present

## 2018-08-12 DIAGNOSIS — E785 Hyperlipidemia, unspecified: Secondary | ICD-10-CM | POA: Diagnosis not present

## 2018-08-12 DIAGNOSIS — R0789 Other chest pain: Principal | ICD-10-CM | POA: Insufficient documentation

## 2018-08-12 DIAGNOSIS — I110 Hypertensive heart disease with heart failure: Secondary | ICD-10-CM | POA: Diagnosis not present

## 2018-08-12 DIAGNOSIS — R935 Abnormal findings on diagnostic imaging of other abdominal regions, including retroperitoneum: Secondary | ICD-10-CM | POA: Insufficient documentation

## 2018-08-12 DIAGNOSIS — Z20828 Contact with and (suspected) exposure to other viral communicable diseases: Secondary | ICD-10-CM | POA: Insufficient documentation

## 2018-08-12 DIAGNOSIS — Z7982 Long term (current) use of aspirin: Secondary | ICD-10-CM | POA: Diagnosis not present

## 2018-08-12 DIAGNOSIS — Z87891 Personal history of nicotine dependence: Secondary | ICD-10-CM | POA: Insufficient documentation

## 2018-08-12 DIAGNOSIS — Z79899 Other long term (current) drug therapy: Secondary | ICD-10-CM | POA: Diagnosis not present

## 2018-08-12 DIAGNOSIS — I5022 Chronic systolic (congestive) heart failure: Secondary | ICD-10-CM | POA: Insufficient documentation

## 2018-08-12 DIAGNOSIS — I2510 Atherosclerotic heart disease of native coronary artery without angina pectoris: Secondary | ICD-10-CM | POA: Insufficient documentation

## 2018-08-12 DIAGNOSIS — F0390 Unspecified dementia without behavioral disturbance: Secondary | ICD-10-CM | POA: Insufficient documentation

## 2018-08-12 DIAGNOSIS — D649 Anemia, unspecified: Secondary | ICD-10-CM | POA: Diagnosis present

## 2018-08-12 DIAGNOSIS — R079 Chest pain, unspecified: Secondary | ICD-10-CM

## 2018-08-12 DIAGNOSIS — K219 Gastro-esophageal reflux disease without esophagitis: Secondary | ICD-10-CM | POA: Insufficient documentation

## 2018-08-12 DIAGNOSIS — I10 Essential (primary) hypertension: Secondary | ICD-10-CM | POA: Diagnosis present

## 2018-08-12 DIAGNOSIS — R9389 Abnormal findings on diagnostic imaging of other specified body structures: Secondary | ICD-10-CM | POA: Diagnosis present

## 2018-08-12 LAB — BASIC METABOLIC PANEL
Anion gap: 10 (ref 5–15)
BUN: 18 mg/dL (ref 8–23)
CO2: 24 mmol/L (ref 22–32)
Calcium: 8.9 mg/dL (ref 8.9–10.3)
Chloride: 102 mmol/L (ref 98–111)
Creatinine, Ser: 1.3 mg/dL — ABNORMAL HIGH (ref 0.61–1.24)
GFR calc Af Amer: >60 mL/min (ref >60)
GFR calc non Af Amer: 53 mL/min — ABNORMAL LOW (ref >60)
Glucose, Bld: 149 mg/dL — ABNORMAL HIGH (ref 70–99)
Potassium: 3.7 mmol/L (ref 3.5–5.1)
Sodium: 136 mmol/L (ref 135–145)

## 2018-08-12 LAB — SARS CORONAVIRUS 2 BY RT PCR (HOSPITAL ORDER, PERFORMED IN ~~LOC~~ HOSPITAL LAB): SARS Coronavirus 2: NEGATIVE

## 2018-08-12 LAB — MAGNESIUM: Magnesium: 2 mg/dL (ref 1.7–2.4)

## 2018-08-12 LAB — PHOSPHORUS: Phosphorus: 2.4 mg/dL — ABNORMAL LOW (ref 2.5–4.6)

## 2018-08-12 LAB — CBC
HCT: 36.6 % — ABNORMAL LOW (ref 39.0–52.0)
Hemoglobin: 11.1 g/dL — ABNORMAL LOW (ref 13.0–17.0)
MCH: 26.3 pg (ref 26.0–34.0)
MCHC: 30.3 g/dL (ref 30.0–36.0)
MCV: 86.7 fL (ref 80.0–100.0)
Platelets: 225 10*3/uL (ref 150–400)
RBC: 4.22 MIL/uL (ref 4.22–5.81)
RDW: 14.6 % (ref 11.5–15.5)
WBC: 12.7 10*3/uL — ABNORMAL HIGH (ref 4.0–10.5)
nRBC: 0 % (ref 0.0–0.2)

## 2018-08-12 LAB — TROPONIN I (HIGH SENSITIVITY)
Troponin I (High Sensitivity): 8 ng/L (ref <18)
Troponin I (High Sensitivity): 8 ng/L (ref <18)

## 2018-08-12 LAB — BRAIN NATRIURETIC PEPTIDE: B Natriuretic Peptide: 158 pg/mL — ABNORMAL HIGH (ref 0.0–100.0)

## 2018-08-12 MED ORDER — ASPIRIN 81 MG PO CHEW
81.0000 mg | CHEWABLE_TABLET | Freq: Every day | ORAL | Status: DC
  Administered 2018-08-13: 81 mg via ORAL
  Filled 2018-08-12: qty 1

## 2018-08-12 MED ORDER — PROCHLORPERAZINE EDISYLATE 10 MG/2ML IJ SOLN: 5.0000 mg | INTRAMUSCULAR | Status: DC | PRN

## 2018-08-12 MED ORDER — MEMANTINE HCL 10 MG PO TABS
10.0000 mg | ORAL_TABLET | Freq: Every day | ORAL | Status: DC
  Administered 2018-08-13: 10 mg via ORAL
  Filled 2018-08-12: qty 1

## 2018-08-12 MED ORDER — METOPROLOL SUCCINATE ER 25 MG PO TB24
25.0000 mg | ORAL_TABLET | Freq: Every day | ORAL | Status: DC
  Administered 2018-08-13: 25 mg via ORAL
  Filled 2018-08-12: qty 1

## 2018-08-12 MED ORDER — HYDRALAZINE HCL 20 MG/ML IJ SOLN
10.0000 mg | INTRAMUSCULAR | Status: DC | PRN
  Administered 2018-08-12: 10 mg via INTRAVENOUS
  Filled 2018-08-12: qty 1

## 2018-08-12 MED ORDER — HYDROMORPHONE HCL 1 MG/ML IJ SOLN
0.7500 mg | INTRAMUSCULAR | Status: DC | PRN
  Administered 2018-08-12: 0.75 mg via INTRAVENOUS
  Filled 2018-08-12: qty 1

## 2018-08-12 MED ORDER — ALFUZOSIN HCL ER 10 MG PO TB24
10.0000 mg | ORAL_TABLET | Freq: Every day | ORAL | Status: DC
  Filled 2018-08-12 (×4): qty 1

## 2018-08-12 MED ORDER — NITROGLYCERIN 0.4 MG SL SUBL: 0.4000 mg | SUBLINGUAL_TABLET | SUBLINGUAL | Status: DC | PRN

## 2018-08-12 MED ORDER — TICAGRELOR 90 MG PO TABS
90.0000 mg | ORAL_TABLET | Freq: Two times a day (BID) | ORAL | Status: DC
  Administered 2018-08-13: 11:00:00 90 mg via ORAL
  Filled 2018-08-12: qty 1

## 2018-08-12 MED ORDER — DONEPEZIL HCL 5 MG PO TABS
10.0000 mg | ORAL_TABLET | Freq: Every day | ORAL | Status: DC
  Administered 2018-08-12: 10 mg via ORAL
  Filled 2018-08-12: qty 1
  Filled 2018-08-12: qty 2

## 2018-08-12 MED ORDER — ACETAMINOPHEN 325 MG PO TABS: 650.0000 mg | ORAL_TABLET | ORAL | Status: DC | PRN

## 2018-08-12 MED ORDER — PANTOPRAZOLE SODIUM 40 MG PO TBEC
40.0000 mg | DELAYED_RELEASE_TABLET | Freq: Every day | ORAL | Status: DC
  Administered 2018-08-13: 40 mg via ORAL
  Filled 2018-08-12: qty 1

## 2018-08-12 MED ORDER — LOSARTAN POTASSIUM 50 MG PO TABS
25.0000 mg | ORAL_TABLET | Freq: Every day | ORAL | Status: DC
  Administered 2018-08-13: 11:00:00 25 mg via ORAL
  Filled 2018-08-12: qty 1

## 2018-08-12 NOTE — ED Triage Notes (Signed)
Uppper mid chest pain 1 day ago with no nausea and vomiting. Patient has history of MI earlier this year in January.  Patient took 1 nitro and 4 81 mg aspirin before EMS arrival.  Denies pain or discomfort at this time.  Vital signs WDL at this time.  Patient alert, oriented with no distress noted.

## 2018-08-12 NOTE — ED Provider Notes (Signed)
[facility] EMERGENCY DEPARTMENT Provider Note   CSN: [id number] Arrival date & time: 08/12/18  1317     History   Chief Complaint Chief Complaint  Patient presents with  . Chest Pain    HPI [name] is a 78 y.o. male.     HPI  [name] is a 78 y.o. male with dementia, hypertension, previous MI in January 2020,  AAA,  and recent left nephrectomy secondary to renal cell carcinoma.  He presents to the Emergency Department with his son who is patient's POA.  He complains of right-sided chest pain.  Patient son states that he checked on him this morning at 11 AM and he noticed that he he appeared somewhat gray in color and was complaining of chest pain.  His son gave 1 sublingual nitroglycerin and 481 mg aspirins without change in symptoms.  He denies shortness of breath, nausea, vomiting, or diaphoresis.  Son states he has noticed occasional productive cough and decreased appetite.  Pt denies shortness of breath, chills, and peripheral edema.    PCP  Dr. Claretta Fraise Urologist  Dr. Gloriann Loan   Past Medical History:  Diagnosis Date  . Dementia [facility])    Son has POA  . Hypertension   . Myocardial infarction [facility])     Patient Active Problem List   Diagnosis Date Noted  . Renal mass 07/11/2018  . Hematuria 05/18/2018  . 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 Essential hypertension 01/31/2018  . Hyperlipidemia with target LDL less than 70 01/31/2018  . Rash of groin 01/31/2018  . NSVT (nonsustained ventricular tachycardia) ([place]) 01/31/2018  . Ischemic cardiomyopathy 01/31/2018    Class: Diagnosis of  . Acute ST elevation myocardial infarction (STEMI) of inferior wall ([place]) 01/30/2018    Class: Hospitalized for    Past Surgical History:  Procedure Laterality Date  . CORONARY/GRAFT ACUTE MI REVASCULARIZATION N/A 01/30/2018   Procedure: Coronary/Graft Acute MI Revascularization;   Surgeon: Martinique, Peter M, MD;  Location: [place] [facility];  Service: Cardiovascular;  Laterality: N/A;  . CYSTOSCOPY WITH RETROGRADE PYELOGRAM, URETEROSCOPY AND STENT PLACEMENT N/A 05/18/2018   Procedure: CYSTOSCOPY;  Surgeon: Lucas Mallow, MD;  Location: [facility];  Service: Urology;  Laterality: N/A;  . CYSTOSCOPY WITH URETEROSCOPY AND STENT PLACEMENT Left 06/11/2018   Procedure: CYSTOSCOPY WITH LEFT  URETEROSCOPY  AND STENT PLACEMENT;  Surgeon: Lucas Mallow, MD;  Location: [facility];  Service: Urology;  Laterality: Left;  . LAPAROSCOPIC NEPHRECTOMY Left 07/11/2018   Procedure: LEFT HAND ASSISTED LAPAROSCOPIC RADICAL NEPHRECTOMY;  Surgeon: Lucas Mallow, MD;  Location: [facility];  Service: Urology;  Laterality: Left;  . LEFT HEART CATH AND CORONARY ANGIOGRAPHY N/A 01/30/2018   Procedure: LEFT HEART CATH AND CORONARY ANGIOGRAPHY;  Surgeon: Martinique, Peter M, MD;  Location: [place] [facility];  Service: Cardiovascular;  Laterality: N/A;  . TRANSURETHRAL RESECTION OF BLADDER TUMOR N/A 05/18/2018   Procedure: TRANSURETHRAL RESECTION OF BLADDER TUMOR (TURBT), CLOT EVACUATION;  Surgeon: Lucas Mallow, MD;  Location: [facility];  Service: Urology;  Laterality: N/A;        Home Medications    Prior to Admission medications   Medication Sig Start Date End Date Taking? Authorizing Provider  alfuzosin (UROXATRAL) 10 MG 24 hr tablet Take 10 mg by mouth at bedtime. 05/30/18  Yes [provider]  aspirin 81 MG chewable tablet Chew  1 tablet (81 mg total) by mouth daily. 02/03/18  Yes Bhagat, Bhavinkumar, PA  atorvastatin (LIPITOR) 80 MG tablet TAKE ONE TABLET BY MOUTH DAILY AT 6 PM 07/31/18  Yes Bhagat, Bhavinkumar, PA  BRILINTA 90 MG TABS tablet Take 90 mg by mouth 2 (two) times daily. 08/03/18  Yes [provider]  cetirizine (ZYRTEC) 10 MG tablet Take 10 mg by mouth daily.   Yes [provider]  donepezil (ARICEPT) 10 MG tablet TAKE ONE TABLET BY MOUTH AT BEDTIME. 07/09/18  Yes  Stacks, Cletus Gash, MD  esomeprazole (NEXIUM) 20 MG capsule Take 20 mg by mouth daily.   Yes [provider]  furosemide (LASIX) 20 MG tablet Take 20 mg by mouth daily as needed for fluid.    Yes [provider]  losartan (COZAAR) 25 MG tablet Take 25 mg by mouth daily. 07/09/18  Yes [provider]  memantine (NAMENDA) 10 MG tablet TAKE ONE TABLET BY MOUTH DAILY. 07/09/18  Yes Stacks, Cletus Gash, MD  metoprolol succinate (TOPROL-XL) 25 MG 24 hr tablet TAKE ONE TABLET BY MOUTH DAILY 07/31/18  Yes Bhagat, Bhavinkumar, PA  tamsulosin (FLOMAX) 0.4 MG CAPS capsule Take 0.4 mg by mouth daily. 07/09/18  Yes [provider]  HYDROcodone-acetaminophen (NORCO) 5-325 MG tablet Take 1 tablet by mouth every 4 (four) hours as needed for moderate pain. 06/11/18   Lucas Mallow, MD  HYDROcodone-acetaminophen (NORCO) 5-325 MG tablet Take 1 tablet by mouth every 4 (four) hours as needed for moderate pain. 07/13/18   Lucas Mallow, MD  nitroGLYCERIN (NITROSTAT) 0.4 MG SL tablet Place 1 tablet (0.4 mg total) under the tongue every 5 (five) minutes as needed. Patient taking differently: Place 0.4 mg under the tongue every 5 (five) minutes as needed for chest pain.  02/02/18   Leanor Kail, PA    Family History Family History  Problem Relation Age of Onset  . Arthritis Mother   . Hypertension Mother   . Obesity Mother   . Stroke Mother   . Diabetes Mother   . Stroke Father   . Pulmonary embolism Sister   . Arthritis Son   . Asthma Son   . Diabetes Son   . Obesity Son   . Heart disease Paternal Grandfather   . Depression Sister     Social History Social History   Tobacco Use  . Smoking status: Former Smoker    Packs/day: 1.00    Years: 25.00    Pack years: 25.00    Types: Cigarettes    Quit date: 02/13/1988    Years since quitting: 30.5  . Smokeless tobacco: Never Used  Substance Use Topics  . Alcohol use: Not Currently  . Drug use: Never     Allergies    Tamsulosin   Review of Systems Review of Systems  Constitutional: Positive for appetite change. Negative for diaphoresis and fever.  Respiratory: Positive for cough. Negative for chest tightness, shortness of breath and wheezing.   Cardiovascular: Positive for chest pain. Negative for leg swelling.  Gastrointestinal: Negative for abdominal pain, diarrhea, nausea and vomiting.  Genitourinary: Negative for decreased urine volume and dysuria.  Musculoskeletal: Negative for back pain.  Skin: Negative for rash.  Neurological: Negative for dizziness, speech difficulty, weakness and numbness.     Physical Exam Updated Vital Signs BP 121/76 (BP Location: Left Arm)   Pulse 60   Temp 98 F (36.7 C) (Oral)   Resp 18   Ht 6' (1.829 m)   [facility]  89.9 kg   SpO2 99%   BMI 26.88 kg/m   Physical Exam Vitals signs and nursing note reviewed.  Constitutional:      Appearance: Normal appearance.  HENT:     Mouth/Throat:     Mouth: Mucous membranes are moist.     Pharynx: Oropharynx is clear.  Eyes:     Extraocular Movements: Extraocular movements intact.     Conjunctiva/sclera: Conjunctivae normal.     Pupils: Pupils are equal, round, and reactive to light.  Neck:     Musculoskeletal: Normal range of motion.     Vascular: No JVD.  Cardiovascular:     Rate and Rhythm: Normal rate and regular rhythm.     Pulses: Normal pulses.  Pulmonary:     Effort: Pulmonary effort is normal. No respiratory distress.     Breath sounds: Normal breath sounds. No wheezing or rales.  Chest:     Chest wall: No tenderness.  Abdominal:     General: There is no distension.     Palpations: Abdomen is soft.     Tenderness: There is no abdominal tenderness. There is no right CVA tenderness, left CVA tenderness or guarding.  Musculoskeletal: Normal range of motion.     Comments: No significant peripheral edema.    Skin:    General: Skin is warm.     Capillary Refill: Capillary refill takes less than 2 seconds.   Neurological:     General: No focal deficit present.     Mental Status: He is alert.      ED Treatments / Results  Labs (all labs ordered are listed, but only abnormal results are displayed) Labs Reviewed  BASIC METABOLIC PANEL - Abnormal; Notable for the following components:      Result Value   Glucose, Bld 149 (*)    Creatinine, Ser 1.30 (*)    GFR calc non Af Amer 53 (*)    All other components within normal limits  CBC - Abnormal; Notable for the following components:   WBC 12.7 (*)    Hemoglobin 11.1 (*)    HCT 36.6 (*)    All other components within normal limits  BRAIN NATRIURETIC PEPTIDE - Abnormal; Notable for the following components:   B Natriuretic Peptide 158.0 (*)    All other components within normal limits  TROPONIN I (HIGH SENSITIVITY)  TROPONIN I (HIGH SENSITIVITY)    EKG EKG Interpretation  Date/Time:  Sunday August 12 2018 13:29:42 EDT Ventricular Rate:  57 PR Interval:    QRS Duration: 102 QT Interval:  436 QTC Calculation: 425 R Axis:   43 Text Interpretation:  Sinus rhythm Inferior infarct, age indeterminate Posterior infarct, old Confirmed by Nat Christen [phone]) on 08/12/2018 3:40:39 PM   Radiology Dg Chest 2 View  Result Date: 08/12/2018 CLINICAL DATA:  Upper mid chest pain. EXAM: CHEST - 2 VIEW COMPARISON:  January 12, 2011 FINDINGS: Cardiomediastinal silhouette is normal. Mediastinal contours appear intact. Moderate right pleural effusion. Right lower lobe atelectasis versus airspace consolidation. Osseous structures are without acute abnormality. Soft tissues are grossly normal. IMPRESSION: Moderate right pleural effusion with right lower lobe atelectasis versus airspace consolidation. Electronically Signed   By: Fidela Salisbury M.D.   On: 08/12/2018 15:05    Procedures Procedures (including critical care time)  Medications Ordered in ED Medications - No data to display   Initial Impression / Assessment and Plan / ED Course  I  have reviewed the triage vital signs and the nursing notes.  Pertinent  labs & imaging results that were available during my care of the patient were reviewed by me and considered in my medical decision making (see chart for details).       Pt with extensive medical history.  Right sided CP w/o dyspnea this morning, unrelieved by NTG and ASA.  Pt describes pain as minimal at present.  CXR shows right pleural effusion.  Given his hx, I will order CT chest to further delineate.  Non contrast since creatinine is elevated.  Vitals reviewed.     HEART pathway score of 5  1710  patient also seen by Dr. Lacinda Axon who assumes pt care, CT chest and BNP pending  Final Clinical Impressions(s) / ED Diagnoses   Final diagnoses:  None    ED Discharge Orders    None       Kem Parkinson, PA-C 08/12/18 Cain Sieve, MD 08/14/18 [phone]

## 2018-08-12 NOTE — H&P (Signed)
History and Physical    [name] IRW:[id number] DOB: 1940/10/17 DOA: 08/12/2018  PCP: Claretta Fraise, MD   Patient coming from: Home.  I have personally briefly reviewed patient's old medical records in [place]  Chief Complaint: Chest pain.  HPI: [name] is a 78 y.o. male with medical history significant of dementia, hypertension, myocardial infarction, recent left nephrectomy who is brought to the emergency department due to left-sided chest pain.  The pain is right/center/RUQ located and worsens with inspiration.  He is able to answer simple questions and denies headache, neck pain, chest pain at this time, back pain, dizziness.  ED Course: White count is 12.7, hemoglobin 11.1 g/dL and platelets 225.  His electrolytes are normal except for mildly decreased phosphorus at 2.4 mg/dL.  Glucose 149, BUN 18, creatinine 1.30 mg/dL.  EKG was sinus rhythm with with all posterior and inferior infarct.  Troponin x2 was normal.  BNP was 158.0 pg/mL.  Recent echocardiogram shows EF of 30 to 35% with severe hypokinesis of entire septal and inferior left ventricular segments.  CT angiogram of the chest shows small right-sided pleural effusion with adjacent chronic appearing atelectasis.  There is diffuse gallbladder wall thickening suspicious for acute cholecystitis follow-up ultrasound is recommended.  Postop changes from nephrectomy.  Please see images and full radiology report for further detail.  Review of Systems:  He is unable to provide full answers.  Past Medical History:  Diagnosis Date  . Dementia [facility])    Son has POA  . Hypertension   . Myocardial infarction [facility])     Past Surgical History:  Procedure Laterality Date  . CORONARY/GRAFT ACUTE MI REVASCULARIZATION N/A 01/30/2018   Procedure: Coronary/Graft Acute MI Revascularization;  Surgeon: Martinique, Peter M, MD;  Location: [place] CV LAB;  Service: Cardiovascular;  Laterality: N/A;  . CYSTOSCOPY WITH RETROGRADE  PYELOGRAM, URETEROSCOPY AND STENT PLACEMENT N/A 05/18/2018   Procedure: CYSTOSCOPY;  Surgeon: Lucas Mallow, MD;  Location: [facility];  Service: Urology;  Laterality: N/A;  . CYSTOSCOPY WITH URETEROSCOPY AND STENT PLACEMENT Left 06/11/2018   Procedure: CYSTOSCOPY WITH LEFT  URETEROSCOPY  AND STENT PLACEMENT;  Surgeon: Lucas Mallow, MD;  Location: [facility];  Service: Urology;  Laterality: Left;  . LAPAROSCOPIC NEPHRECTOMY Left 07/11/2018   Procedure: LEFT HAND ASSISTED LAPAROSCOPIC RADICAL NEPHRECTOMY;  Surgeon: Lucas Mallow, MD;  Location: [facility];  Service: Urology;  Laterality: Left;  . LEFT HEART CATH AND CORONARY ANGIOGRAPHY N/A 01/30/2018   Procedure: LEFT HEART CATH AND CORONARY ANGIOGRAPHY;  Surgeon: Martinique, Peter M, MD;  Location: [place] CV LAB;  Service: Cardiovascular;  Laterality: N/A;  . TRANSURETHRAL RESECTION OF BLADDER TUMOR N/A 05/18/2018   Procedure: TRANSURETHRAL RESECTION OF BLADDER TUMOR (TURBT), CLOT EVACUATION;  Surgeon: Lucas Mallow, MD;  Location: [facility];  Service: Urology;  Laterality: N/A;     reports that he quit smoking about 30 years ago. His smoking use included cigarettes. He has a 25.00 pack-year smoking history. He has never used smokeless tobacco. He reports previous alcohol use. He reports that he does not use drugs.  Allergies  Allergen Reactions  . Tamsulosin     Dropped bp too low    Family History  Problem Relation Age of Onset  . Arthritis Mother   . Hypertension Mother   . Obesity Mother   . Stroke Mother   . Diabetes Mother   . Stroke Father   . Pulmonary embolism Sister   .  Arthritis Son   . Asthma Son   . Diabetes Son   . Obesity Son   . Heart disease Paternal Grandfather   . Depression Sister    Prior to Admission medications   Medication Sig Start Date End Date Taking? Authorizing Provider  alfuzosin (UROXATRAL) 10 MG 24 hr tablet Take 10 mg by mouth at bedtime. 05/30/18  Yes [provider]  aspirin 81 MG  chewable tablet Chew 1 tablet (81 mg total) by mouth daily. 02/03/18  Yes Bhagat, Bhavinkumar, PA  atorvastatin (LIPITOR) 80 MG tablet TAKE ONE TABLET BY MOUTH DAILY AT 6 PM 07/31/18  Yes Bhagat, Bhavinkumar, PA  BRILINTA 90 MG TABS tablet Take 90 mg by mouth 2 (two) times daily. 08/03/18  Yes [provider]  cetirizine (ZYRTEC) 10 MG tablet Take 10 mg by mouth daily.   Yes [provider]  donepezil (ARICEPT) 10 MG tablet TAKE ONE TABLET BY MOUTH AT BEDTIME. 07/09/18  Yes Stacks, Cletus Gash, MD  esomeprazole (NEXIUM) 20 MG capsule Take 20 mg by mouth daily.   Yes [provider]  furosemide (LASIX) 20 MG tablet Take 20 mg by mouth daily as needed for fluid.    Yes [provider]  losartan (COZAAR) 25 MG tablet Take 25 mg by mouth daily. 07/09/18  Yes [provider]  memantine (NAMENDA) 10 MG tablet TAKE ONE TABLET BY MOUTH DAILY. 07/09/18  Yes Stacks, Cletus Gash, MD  metoprolol succinate (TOPROL-XL) 25 MG 24 hr tablet TAKE ONE TABLET BY MOUTH DAILY 07/31/18  Yes Bhagat, Bhavinkumar, PA  tamsulosin (FLOMAX) 0.4 MG CAPS capsule Take 0.4 mg by mouth daily. 07/09/18  Yes [provider]  HYDROcodone-acetaminophen (NORCO) 5-325 MG tablet Take 1 tablet by mouth every 4 (four) hours as needed for moderate pain. 06/11/18   Lucas Mallow, MD  HYDROcodone-acetaminophen (NORCO) 5-325 MG tablet Take 1 tablet by mouth every 4 (four) hours as needed for moderate pain. 07/13/18   Lucas Mallow, MD  nitroGLYCERIN (NITROSTAT) 0.4 MG SL tablet Place 1 tablet (0.4 mg total) under the tongue every 5 (five) minutes as needed. Patient taking differently: Place 0.4 mg under the tongue every 5 (five) minutes as needed for chest pain.  02/02/18   Leanor Kail, PA    Physical Exam: Vitals:   08/12/18 1954 08/12/18 2102 08/12/18 2130 08/12/18 2202  BP: (!) 124/97 (!) 152/118 130/88 (!) 160/97  Pulse: 66 72 83 76  Resp: (!) 26 19 18 20   Temp:      TempSrc:      SpO2:  99% 99% 99% 99%  Weight:      Height:        Constitutional: NAD, calm, comfortable Eyes: PERRL, lids and conjunctivae normal ENMT: Mucous membranes are moist. Posterior pharynx clear of any exudate or lesions. Neck: normal, supple, no masses, no thyromegaly Respiratory: Decreased breath sounds in bases, otherwise clear to auscultation bilaterally, no wheezing, no crackles. Normal respiratory effort. No accessory muscle use.  Cardiovascular: Regular rate and rhythm, no murmurs / rubs / gallops. No extremity edema. 2+ pedal pulses. No carotid bruits.  Abdomen: Nondistended, surgical incisions left nephrectomy, bowel sounds positive.  Soft, positive RUQ tenderness, positive left CVA, no guarding or rebound, no masses palpated. No hepatosplenomegaly. Bowel sounds positive.  Musculoskeletal: no clubbing / cyanosis. Good ROM, no contractures. Normal muscle tone.  Skin: no rashes, lesions, ulcers on limited dermatological examination. Neurologic: CN 2-12 grossly intact. Sensation intact, DTR normal. Strength 5/5 in all  4.  Psychiatric: Alert and oriented x 2, disoriented to time and partially to situation.. Normal mood.   Labs on Admission: I have personally reviewed following labs and imaging studies  CBC: Recent Labs  Lab 08/12/18 1358  WBC 12.7*  HGB 11.1*  HCT 36.6*  MCV 86.7  PLT 401   Basic Metabolic Panel: Recent Labs  Lab 08/12/18 1358  NA 136  K 3.7  CL 102  CO2 24  GLUCOSE 149*  BUN 18  CREATININE 1.30*  CALCIUM 8.9  MG 2.0  PHOS 2.4*   GFR: Estimated Creatinine Clearance: 53.1 mL/min (A) (by C-G formula based on SCr of 1.3 mg/dL (H)). Liver Function Tests: No results for input(s): AST, ALT, ALKPHOS, BILITOT, PROT, ALBUMIN in the last 168 hours. No results for input(s): LIPASE, AMYLASE in the last 168 hours. No results for input(s): AMMONIA in the last 168 hours. Coagulation Profile: No results for input(s): INR, PROTIME in the last 168 hours. Cardiac Enzymes:  No results for input(s): CKTOTAL, CKMB, CKMBINDEX, TROPONINI in the last 168 hours. BNP (last 3 results) No results for input(s): PROBNP in the last 8760 hours. HbA1C: No results for input(s): HGBA1C in the last 72 hours. CBG: No results for input(s): GLUCAP in the last 168 hours. Lipid Profile: No results for input(s): CHOL, HDL, LDLCALC, TRIG, CHOLHDL, LDLDIRECT in the last 72 hours. Thyroid Function Tests: No results for input(s): TSH, T4TOTAL, FREET4, T3FREE, THYROIDAB in the last 72 hours. Anemia Panel: No results for input(s): VITAMINB12, FOLATE, FERRITIN, TIBC, IRON, RETICCTPCT in the last 72 hours. Urine analysis: No results found for: COLORURINE, APPEARANCEUR, LABSPEC, PHURINE, GLUCOSEU, HGBUR, BILIRUBINUR, KETONESUR, PROTEINUR, UROBILINOGEN, NITRITE, LEUKOCYTESUR  Radiological Exams on Admission: Dg Chest 2 View  Result Date: 08/12/2018 CLINICAL DATA:  Upper mid chest pain. EXAM: CHEST - 2 VIEW COMPARISON:  January 12, 2011 FINDINGS: Cardiomediastinal silhouette is normal. Mediastinal contours appear intact. Moderate right pleural effusion. Right lower lobe atelectasis versus airspace consolidation. Osseous structures are without acute abnormality. Soft tissues are grossly normal. IMPRESSION: Moderate right pleural effusion with right lower lobe atelectasis versus airspace consolidation. Electronically Signed   By: Fidela Salisbury M.D.   On: 08/12/2018 15:05   Ct Chest Wo Contrast  Result Date: 08/12/2018 CLINICAL DATA:  Pleural effusion with history of renal cell carcinoma and nephrectomy. Upper abdominal pain x1 day. EXAM: CT CHEST WITHOUT CONTRAST TECHNIQUE: Multidetector CT imaging of the chest was performed following the standard protocol without IV contrast. COMPARISON:  None. FINDINGS: Cardiovascular: The heart size is significantly enlarged. The ascending aorta is ectatic measuring approximately 4.2 cm in diameter. The main pulmonary artery is dilated measuring  approximately 3.2 cm.Coronary artery calcifications are noted. Vascular calcifications are noted of the thoracic aorta. There is no significant pericardial effusion. Mediastinum/Nodes: --No mediastinal or hilar lymphadenopathy. --No axillary lymphadenopathy. --No supraclavicular lymphadenopathy. --Normal thyroid gland. --The esophagus is unremarkable Lungs/Pleura: There is a small right-sided pleural effusion with adjacent chronic appearing atelectasis. There is no pneumothorax. There is no discrete pulmonary nodule. Upper Abdomen: There is diffuse wall thickening of the gallbladder. The patient appears to be status post left nephrectomy. There is a partially visualized soft tissue density measuring approximately 5.1 x 4.9 cm in the left nephrectomy bed. The visualized pancreas and spleen are unremarkable. Musculoskeletal: No chest wall abnormality. No acute or significant osseous findings. Review of the MIP images confirms the above findings. IMPRESSION: 1. Small right-sided pleural effusion with adjacent chronic appearing atelectasis. 2. Diffuse gallbladder wall thickening suspicious for  acute cholecystitis. Follow-up with ultrasound is recommended. 3. A 5.1 x 4.9 cm soft tissue density in the left nephrectomy bed. This is only partially visualized. Differential considerations include a postoperative seroma or hematoma. An abscess is not entirely excluded. Residual tumor seems less likely. Follow-up with a contrast-enhanced CT is recommended. 4. Dilated ascending aorta as detailed above. Recommend annual imaging followup by CTA or MRA. This recommendation follows 2010 ACCF/AHA/AATS/ACR/ASA/SCA/SCAI/SIR/STS/SVM Guidelines for the Diagnosis and Management of Patients with Thoracic Aortic Disease. Circulation. 2010; 121: Z563-O756. Aortic aneurysm NOS (ICD10-I71.9) 5. Dilated main pulmonary artery which can be seen in patients with elevated PA pressures. Aortic Atherosclerosis (ICD10-I70.0). Electronically Signed    By: Constance Holster M.D.   On: 08/12/2018 16:56   01/31/2018 echo  IMPRESSIONS:   1. The left ventricle appears to be normal in size, has moderate wall thickness 30-35% ejection fraction Spectral Doppler shows impaired relaxation pattern of diastolic filling.  2. There is severe hypokinesis of the entire septal and inferior left ventricular segments.  3. The right ventricle has normal size and mildly reduced systolic function.  4. Right ventricular systolic pressure is could not be assessed.  5. Normal left atrial size.  6. Normal right atrial size.  7. Mitral valve regurgitation is trivial by color flow Doppler.  8. No atrial level shunt detected by color flow Doppler.  EKG: Independently reviewed. Vent. rate 57 BPM PR interval * ms QRS duration 102 ms QT/QTc 436/425 ms P-R-T axes 47 43 -28 Sinus rhythm Inferior infarct, age indeterminate Posterior infarct, old  Assessment/Plan Principal Problem:   Atypical chest pain Pain is right-sided and likely radiated from RUQ. Observation/telemetry. Continue analgesics as needed. Continue antiemetics as needed. Check RUQ ultrasound in a.m. Consider CT abdomen if renal function allows.  Active Problems:   Multiple vessel coronary artery disease -    PTCA of 100% RCA, Severe dLM-LAD disease (med Rx) Continue aspirin, atorvastatin, Brilinta and metoprolol.    Essential hypertension Continue losartan 25 mg p.o. daily. Continue metoprolol XL 25 mg p.o. daily. Monitor BP, HR, renal function electrolytes    Hyperlipidemia with target LDL less than 70 Continue atorvastatin.    Anemia Monitor H&H.    Hypophosphatemia Replaced orally.    Abnormal CT of the chest Check RUQ ultrasound in a.m. Consider repeat of CT, although these findings might be postop.     DVT prophylaxis: SCDs. Code Status: Full code Family Communication: Disposition Plan: Observation for chest pain Consults called: Admission status:  Telemetry/observation.   Reubin Milan MD [facility]  If 7PM-7AM, please contact night-coverage [email or web address]  08/12/2018, 10:30 PM   This document was prepared using Dragon voice recognition software and may contain some unintended transcription errors.

## 2018-08-13 ENCOUNTER — Observation Stay (HOSPITAL_COMMUNITY): Payer: Medicare Other

## 2018-08-13 ENCOUNTER — Other Ambulatory Visit: Payer: Self-pay

## 2018-08-13 DIAGNOSIS — K219 Gastro-esophageal reflux disease without esophagitis: Secondary | ICD-10-CM

## 2018-08-13 DIAGNOSIS — R0789 Other chest pain: Secondary | ICD-10-CM | POA: Diagnosis not present

## 2018-08-13 DIAGNOSIS — K828 Other specified diseases of gallbladder: Secondary | ICD-10-CM | POA: Diagnosis not present

## 2018-08-13 DIAGNOSIS — K8020 Calculus of gallbladder without cholecystitis without obstruction: Secondary | ICD-10-CM

## 2018-08-13 DIAGNOSIS — E785 Hyperlipidemia, unspecified: Secondary | ICD-10-CM

## 2018-08-13 DIAGNOSIS — R9389 Abnormal findings on diagnostic imaging of other specified body structures: Secondary | ICD-10-CM | POA: Diagnosis not present

## 2018-08-13 DIAGNOSIS — I10 Essential (primary) hypertension: Secondary | ICD-10-CM | POA: Diagnosis not present

## 2018-08-13 MED ORDER — PIPERACILLIN-TAZOBACTAM 3.375 G IVPB 30 MIN: 3.3750 g | Freq: Three times a day (TID) | INTRAVENOUS | Status: DC

## 2018-08-13 MED ORDER — PIPERACILLIN-TAZOBACTAM 3.375 G IVPB
3.3750 g | Freq: Three times a day (TID) | INTRAVENOUS | Status: DC
  Administered 2018-08-13 (×2): 3.375 g via INTRAVENOUS
  Filled 2018-08-13 (×2): qty 50

## 2018-08-13 NOTE — Progress Notes (Signed)
IV x2, telemetry removed. D/C instructions reviewed with patient and son, verbalized understanding. Patient transported to private vehicle via wheelchair.

## 2018-08-13 NOTE — Discharge Summary (Addendum)
Physician Discharge Summary  [name] VEL:[id number] DOB: 1940-03-28 DOA: 08/12/2018  PCP: Claretta Fraise, MD  Admit date: 08/12/2018 Discharge date: 08/13/2018  Time spent: 35 minutes  Recommendations for Outpatient Follow-up:  1. Repeat CMEt to follow LFT's, electrolytes and renal function 2. Close follow-up on resolution patient symptoms and if needed evaluation by general surgery.   Discharge Diagnoses:  Principal Problem:   Atypical chest pain Active Problems:   Multiple vessel coronary artery disease - PTCA of 100% RCA, Severe dLM-LAD disease (med Rx)   Essential hypertension   Hyperlipidemia with target LDL less than 70   Anemia   Hypophosphatemia   Abnormal CT of the chest   Calculus of gallbladder without cholecystitis without obstruction   Gastroesophageal reflux disease   Discharge Condition: Stable and improved.  Patient discharged home with instruction to follow-up with PCP and cardiology service as previously instructed.  Diet recommendation: Heart healthy diet  Filed Weights   08/12/18 1328 08/13/18 0230  Weight: 89.9 kg 78.3 kg    History of present illness:  As per H&P written by Dr. Olevia Bowens 08/12/2018 78 y.o. male with medical history significant of dementia, hypertension, myocardial infarction, recent left nephrectomy who is brought to the emergency department due to left-sided chest pain.  The pain is right/center/RUQ located and worsens with inspiration.  He is able to answer simple questions and denies headache, neck pain, chest pain at this time, back pain, dizziness.  ED Course: White count is 12.7, hemoglobin 11.1 g/dL and platelets 225.  His electrolytes are normal except for mildly decreased phosphorus at 2.4 mg/dL.  Glucose 149, BUN 18, creatinine 1.30 mg/dL.  EKG was sinus rhythm with with all posterior and inferior infarct.  Troponin x2 was normal.  BNP was 158.0 pg/mL.  Recent echocardiogram shows EF of 30 to 35% with severe hypokinesis of entire  septal and inferior left ventricular segments.  CT angiogram of the chest shows small right-sided pleural effusion with adjacent chronic appearing atelectasis.  There is diffuse gallbladder wall thickening suspicious for acute cholecystitis follow-up ultrasound is recommended.  Postop changes from nephrectomy.  Please see images and full radiology report for further detail  Hospital Course:  1-atypical chest pain -No acute ischemic changes on telemetry or EKG -Troponin high sensitive negative x2 -Patient pain-free at time of examination on discharge -Symptoms are most likely associated with either biliary colic or gastroesophageal reflux disease -After discussing with family and given patient's free symptoms decision has been made to continue medical management only and continue monitoring.  He will remain been at high risk for any kind of intervention or surgery.  2-cholelithiasis -CT chest demonstrating abnormal gallbladder anatomy -Right upper quadrant ultrasound demonstrating sludge gallbladder with positive gallstone -No signs of acute cholecystitis -Patient asymptomatic at time of discharge -Continue outpatient follow-up -focus on symptomatic management, following family plan of care base on patient's risk and current symptoms.  3-hyperlipidemia -Continue statin  4-essential hypertension -Continue losartan and metoprolol  5-history of dementia -No behavioral disturbances at this time -Continue Aricept and Namenda.  6-gastroesophageal reflux disease -Continue Nexium  7-prior history of multiple basal coronary artery disease -Continue aspirin, Lipitor, Brilinta and metoprolol -Patient found not to be a candidate for CABG. -Continue medical management and follow-up with cardiology as an outpatient.  8-chronic systolic HF -stable and compensated -continue current medication regimen and outpatient follow up with cardiology -instructed to follow low sodium diet and to check  weight on daily basis   Procedures:  See below for x-ray  reports.  Consultations:  None  Discharge Exam: Vitals:   08/13/18 0525 08/13/18 1427  BP: (!) 143/98 (!) 121/91  Pulse: (!) 110 98  Resp: 18 18  Temp: 98.9 F (37.2 C) 97.8 F (36.6 C)  SpO2: 100% 98%    General: Afebrile, no chest pain, no nausea, no vomiting, reports no abdominal discomfort currently. Cardiovascular: S1 and S2, no rubs, no gallops, no JVD. Respiratory: Good air movement bilaterally, no using accessory muscle. Abdomen: Soft, nontender, positive bowel sounds Extremities: No cyanosis, no clubbing, no edema.  Discharge Instructions   Discharge Instructions    (HEART FAILURE PATIENTS) Call MD:  Anytime you have any of the following symptoms: 1) 3 pound weight gain in 24 hours or 5 pounds in 1 week 2) shortness of breath, with or without a dry hacking cough 3) swelling in the hands, feet or stomach 4) if you have to sleep on extra pillows at night in order to breathe.   Complete by: As directed    Diet - low sodium heart healthy   Complete by: As directed    Discharge instructions   Complete by: As directed    Take medications as prescribed Follow heart healthy diet Check your weight on daily basis Arrange follow-up with PCP in 10 days.   Increase activity slowly   Complete by: As directed      Allergies as of 08/13/2018      Reactions   Tamsulosin    Dropped bp too low      Medication List    STOP taking these medications   HYDROcodone-acetaminophen 5-325 MG tablet Commonly known as: Norco     TAKE these medications   alfuzosin 10 MG 24 hr tablet Commonly known as: UROXATRAL Take 10 mg by mouth at bedtime.   aspirin 81 MG chewable tablet Chew 1 tablet (81 mg total) by mouth daily.   atorvastatin 80 MG tablet Commonly known as: LIPITOR TAKE ONE TABLET BY MOUTH DAILY AT 6 PM   Brilinta 90 MG Tabs tablet Generic drug: ticagrelor Take 90 mg by mouth 2 (two) times daily.    cetirizine 10 MG tablet Commonly known as: ZYRTEC Take 10 mg by mouth daily.   donepezil 10 MG tablet Commonly known as: ARICEPT TAKE ONE TABLET BY MOUTH AT BEDTIME.   esomeprazole 20 MG capsule Commonly known as: NEXIUM Take 20 mg by mouth daily.   furosemide 20 MG tablet Commonly known as: LASIX Take 20 mg by mouth daily as needed for fluid.   losartan 25 MG tablet Commonly known as: COZAAR Take 25 mg by mouth daily.   memantine 10 MG tablet Commonly known as: NAMENDA TAKE ONE TABLET BY MOUTH DAILY.   metoprolol succinate 25 MG 24 hr tablet Commonly known as: TOPROL-XL TAKE ONE TABLET BY MOUTH DAILY   nitroGLYCERIN 0.4 MG SL tablet Commonly known as: Nitrostat Place 1 tablet (0.4 mg total) under the tongue every 5 (five) minutes as needed. What changed: reasons to take this   tamsulosin 0.4 MG Caps capsule Commonly known as: FLOMAX Take 0.4 mg by mouth daily.      Allergies  Allergen Reactions  . Tamsulosin     Dropped bp too low   Follow-up Information    Claretta Fraise, MD. Schedule an appointment as soon as possible for a visit in 10 day(s).   Specialty: Family Medicine Contact information: [place] [address] [phone]        [place], Peter M, MD .  Specialty: Cardiology Contact information: [address] [place] [address] [phone]           The results of significant diagnostics from this hospitalization (including imaging, microbiology, ancillary and laboratory) are listed below for reference.    Significant Diagnostic Studies: Dg Chest 2 View  Result Date: 08/12/2018 CLINICAL DATA:  Upper mid chest pain. EXAM: CHEST - 2 VIEW COMPARISON:  January 12, 2011 FINDINGS: Cardiomediastinal silhouette is normal. Mediastinal contours appear intact. Moderate right pleural effusion. Right lower lobe atelectasis versus airspace consolidation. Osseous structures are without acute abnormality. Soft tissues are  grossly normal. IMPRESSION: Moderate right pleural effusion with right lower lobe atelectasis versus airspace consolidation. Electronically Signed   By: Fidela Salisbury M.D.   On: 08/12/2018 15:05   Ct Chest Wo Contrast  Result Date: 08/12/2018 CLINICAL DATA:  Pleural effusion with history of renal cell carcinoma and nephrectomy. Upper abdominal pain x1 day. EXAM: CT CHEST WITHOUT CONTRAST TECHNIQUE: Multidetector CT imaging of the chest was performed following the standard protocol without IV contrast. COMPARISON:  None. FINDINGS: Cardiovascular: The heart size is significantly enlarged. The ascending aorta is ectatic measuring approximately 4.2 cm in diameter. The main pulmonary artery is dilated measuring approximately 3.2 cm.Coronary artery calcifications are noted. Vascular calcifications are noted of the thoracic aorta. There is no significant pericardial effusion. Mediastinum/Nodes: --No mediastinal or hilar lymphadenopathy. --No axillary lymphadenopathy. --No supraclavicular lymphadenopathy. --Normal thyroid gland. --The esophagus is unremarkable Lungs/Pleura: There is a small right-sided pleural effusion with adjacent chronic appearing atelectasis. There is no pneumothorax. There is no discrete pulmonary nodule. Upper Abdomen: There is diffuse wall thickening of the gallbladder. The patient appears to be status post left nephrectomy. There is a partially visualized soft tissue density measuring approximately 5.1 x 4.9 cm in the left nephrectomy bed. The visualized pancreas and spleen are unremarkable. Musculoskeletal: No chest wall abnormality. No acute or significant osseous findings. Review of the MIP images confirms the above findings. IMPRESSION: 1. Small right-sided pleural effusion with adjacent chronic appearing atelectasis. 2. Diffuse gallbladder wall thickening suspicious for acute cholecystitis. Follow-up with ultrasound is recommended. 3. A 5.1 x 4.9 cm soft tissue density in the left  nephrectomy bed. This is only partially visualized. Differential considerations include a postoperative seroma or hematoma. An abscess is not entirely excluded. Residual tumor seems less likely. Follow-up with a contrast-enhanced CT is recommended. 4. Dilated ascending aorta as detailed above. Recommend annual imaging followup by CTA or MRA. This recommendation follows 2010 ACCF/AHA/AATS/ACR/ASA/SCA/SCAI/SIR/STS/SVM Guidelines for the Diagnosis and Management of Patients with Thoracic Aortic Disease. Circulation. 2010; 121: A250-N397. Aortic aneurysm NOS (ICD10-I71.9) 5. Dilated main pulmonary artery which can be seen in patients with elevated PA pressures. Aortic Atherosclerosis (ICD10-I70.0). Electronically Signed   By: Constance Holster M.D.   On: 08/12/2018 16:56   US Abdomen Limited Ruq  Result Date: 08/13/2018 CLINICAL DATA:  Right upper quadrant abdominal pain EXAM: ULTRASOUND ABDOMEN LIMITED RIGHT UPPER QUADRANT COMPARISON:  05/17/2018 CT FINDINGS: Gallbladder: Layering sludge. The gallbladder is moderately distended without wall thickening or focal tenderness (confirmed with technologist). No shadowing calculi but there were likely small stones by comparison CT. Common bile duct: Diameter: 3 mm. Limited coverage of the duct with no visible filling defect. Liver: No focal lesion identified. Within normal limits in parenchymal echogenicity. Portal vein is patent on color Doppler imaging with normal direction of blood flow towards the liver. Other: Right pleural effusion that is trace where seen. IMPRESSION: Gallbladder sludge with calculi seen on  May 2020 abdominal CT. No indication of acute cholecystitis. Electronically Signed   By: Monte Fantasia M.D.   On: 08/13/2018 10:04    Microbiology: Recent Results (from the past 240 hour(s))  SARS Coronavirus 2 [facility] order, Performed in [facility] hospital lab) Nasopharyngeal Nasopharyngeal Swab     Status: None   Collection Time: 08/12/18  7:06 PM    Specimen: Nasopharyngeal Swab  Result Value Ref Range Status   SARS Coronavirus 2 NEGATIVE NEGATIVE Final    Comment: (NOTE) If result is NEGATIVE SARS-CoV-2 target nucleic acids are NOT DETECTED. The SARS-CoV-2 RNA is generally detectable in upper and lower  respiratory specimens during the acute phase of infection. The lowest  concentration of SARS-CoV-2 viral copies this assay can detect is 250  copies / mL. A negative result does not preclude SARS-CoV-2 infection  and should not be used as the sole basis for treatment or other  patient management decisions.  A negative result may occur with  improper specimen collection / handling, submission of specimen other  than nasopharyngeal swab, presence of viral mutation(s) within the  areas targeted by this assay, and inadequate number of viral copies  (<250 copies / mL). A negative result must be combined with clinical  observations, patient history, and epidemiological information. If result is POSITIVE SARS-CoV-2 target nucleic acids are DETECTED. The SARS-CoV-2 RNA is generally detectable in upper and lower  respiratory specimens dur ing the acute phase of infection.  Positive  results are indicative of active infection with SARS-CoV-2.  Clinical  correlation with patient history and other diagnostic information is  necessary to determine patient infection status.  Positive results do  not rule out bacterial infection or co-infection with other viruses. If result is PRESUMPTIVE POSTIVE SARS-CoV-2 nucleic acids MAY BE PRESENT.   A presumptive positive result was obtained on the submitted specimen  and confirmed on repeat testing.  While 2019 novel coronavirus  (SARS-CoV-2) nucleic acids may be present in the submitted sample  additional confirmatory testing may be necessary for epidemiological  and / or clinical management purposes  to differentiate between  SARS-CoV-2 and other Sarbecovirus currently known to infect humans.  If  clinically indicated additional testing with an alternate test  methodology [phone]) is advised. The SARS-CoV-2 RNA is generally  detectable in upper and lower respiratory sp ecimens during the acute  phase of infection. The expected result is Negative. Fact Sheet for Patients:  StrictlyIdeas.no Fact Sheet for Healthcare Providers: BankingDealers.co.za This test is not yet approved or cleared by the [place] FDA and has been authorized for detection and/or diagnosis of SARS-CoV-2 by FDA under an Emergency Use Authorization (EUA).  This EUA will remain in effect (meaning this test can be used) for the duration of the COVID-19 declaration under Section 564(b)(1) of the Act, 21 U.S.C. section 360bbb-3(b)(1), unless the authorization is terminated or revoked sooner. Performed at [facility], [address], [place], [place] [address]      Labs: Basic Metabolic Panel: Recent Labs  Lab 08/12/18 1358  NA 136  K 3.7  CL 102  CO2 24  GLUCOSE 149*  BUN 18  CREATININE 1.30*  CALCIUM 8.9  MG 2.0  PHOS 2.4*   CBC: Recent Labs  Lab 08/12/18 1358  WBC 12.7*  HGB 11.1*  HCT 36.6*  MCV 86.7  PLT 225   BNP (last 3 results) Recent Labs    08/12/18 1358  BNP 158.0*    Signed:  Barton Dubois MD.  [facility] 08/13/2018, 2:41 PM

## 2018-08-13 NOTE — Progress Notes (Signed)
CRITICAL VALUE ALERT  Critical Value:  Blood culture  Date & Time Notied:  08/13/2018, 2800  Provider Notified: [place]  Orders Received/Actions taken: [place] to review results. No further orders at this time

## 2018-08-13 NOTE — Progress Notes (Signed)
Notified by central telemetry patient's HR staying elevated. When ambulating, HR up to 160-170s. Laying in bed, HR 120-140 sustaining. Dr. Dyann Kief made aware, scheduled metoprolol given. Will continue to monitor.

## 2018-08-22 ENCOUNTER — Other Ambulatory Visit: Payer: Self-pay

## 2018-08-22 ENCOUNTER — Ambulatory Visit (INDEPENDENT_AMBULATORY_CARE_PROVIDER_SITE_OTHER): Payer: Medicare Other | Admitting: Family Medicine

## 2018-08-22 ENCOUNTER — Inpatient Hospital Stay (HOSPITAL_COMMUNITY)
Admission: AD | Admit: 2018-08-22 | Discharge: 2018-09-04 | DRG: 444 | Disposition: E | Payer: Medicare Other | Source: Other Acute Inpatient Hospital | Attending: Specialist | Admitting: Specialist

## 2018-08-22 DIAGNOSIS — Z823 Family history of stroke: Secondary | ICD-10-CM

## 2018-08-22 DIAGNOSIS — I719 Aortic aneurysm of unspecified site, without rupture: Secondary | ICD-10-CM | POA: Diagnosis not present

## 2018-08-22 DIAGNOSIS — E162 Hypoglycemia, unspecified: Secondary | ICD-10-CM | POA: Diagnosis not present

## 2018-08-22 DIAGNOSIS — R1013 Epigastric pain: Secondary | ICD-10-CM | POA: Diagnosis not present

## 2018-08-22 DIAGNOSIS — E876 Hypokalemia: Secondary | ICD-10-CM | POA: Diagnosis present

## 2018-08-22 DIAGNOSIS — Z87891 Personal history of nicotine dependence: Secondary | ICD-10-CM | POA: Diagnosis not present

## 2018-08-22 DIAGNOSIS — A419 Sepsis, unspecified organism: Secondary | ICD-10-CM | POA: Diagnosis not present

## 2018-08-22 DIAGNOSIS — D72829 Elevated white blood cell count, unspecified: Secondary | ICD-10-CM

## 2018-08-22 DIAGNOSIS — R05 Cough: Secondary | ICD-10-CM

## 2018-08-22 DIAGNOSIS — D649 Anemia, unspecified: Secondary | ICD-10-CM | POA: Diagnosis present

## 2018-08-22 DIAGNOSIS — I509 Heart failure, unspecified: Secondary | ICD-10-CM

## 2018-08-22 DIAGNOSIS — F0390 Unspecified dementia without behavioral disturbance: Secondary | ICD-10-CM | POA: Diagnosis present

## 2018-08-22 DIAGNOSIS — M6250 Muscle wasting and atrophy, not elsewhere classified, unspecified site: Secondary | ICD-10-CM | POA: Diagnosis present

## 2018-08-22 DIAGNOSIS — Z79899 Other long term (current) drug therapy: Secondary | ICD-10-CM | POA: Diagnosis not present

## 2018-08-22 DIAGNOSIS — Z7982 Long term (current) use of aspirin: Secondary | ICD-10-CM | POA: Diagnosis not present

## 2018-08-22 DIAGNOSIS — I2510 Atherosclerotic heart disease of native coronary artery without angina pectoris: Secondary | ICD-10-CM | POA: Diagnosis present

## 2018-08-22 DIAGNOSIS — Z209 Contact with and (suspected) exposure to unspecified communicable disease: Secondary | ICD-10-CM | POA: Diagnosis not present

## 2018-08-22 DIAGNOSIS — I10 Essential (primary) hypertension: Secondary | ICD-10-CM | POA: Diagnosis not present

## 2018-08-22 DIAGNOSIS — Z8261 Family history of arthritis: Secondary | ICD-10-CM

## 2018-08-22 DIAGNOSIS — R5383 Other fatigue: Secondary | ICD-10-CM

## 2018-08-22 DIAGNOSIS — Z4682 Encounter for fitting and adjustment of non-vascular catheter: Secondary | ICD-10-CM | POA: Diagnosis not present

## 2018-08-22 DIAGNOSIS — R54 Age-related physical debility: Secondary | ICD-10-CM | POA: Diagnosis present

## 2018-08-22 DIAGNOSIS — M4800 Spinal stenosis, site unspecified: Secondary | ICD-10-CM | POA: Diagnosis present

## 2018-08-22 DIAGNOSIS — K769 Liver disease, unspecified: Secondary | ICD-10-CM | POA: Diagnosis present

## 2018-08-22 DIAGNOSIS — K8000 Calculus of gallbladder with acute cholecystitis without obstruction: Principal | ICD-10-CM | POA: Diagnosis present

## 2018-08-22 DIAGNOSIS — Z9861 Coronary angioplasty status: Secondary | ICD-10-CM

## 2018-08-22 DIAGNOSIS — Z905 Acquired absence of kidney: Secondary | ICD-10-CM

## 2018-08-22 DIAGNOSIS — K828 Other specified diseases of gallbladder: Secondary | ICD-10-CM | POA: Diagnosis present

## 2018-08-22 DIAGNOSIS — Z888 Allergy status to other drugs, medicaments and biological substances status: Secondary | ICD-10-CM

## 2018-08-22 DIAGNOSIS — Z825 Family history of asthma and other chronic lower respiratory diseases: Secondary | ICD-10-CM

## 2018-08-22 DIAGNOSIS — N2889 Other specified disorders of kidney and ureter: Secondary | ICD-10-CM | POA: Diagnosis present

## 2018-08-22 DIAGNOSIS — A0472 Enterocolitis due to Clostridium difficile, not specified as recurrent: Secondary | ICD-10-CM | POA: Diagnosis present

## 2018-08-22 DIAGNOSIS — Z818 Family history of other mental and behavioral disorders: Secondary | ICD-10-CM

## 2018-08-22 DIAGNOSIS — Z833 Family history of diabetes mellitus: Secondary | ICD-10-CM

## 2018-08-22 DIAGNOSIS — R579 Shock, unspecified: Secondary | ICD-10-CM | POA: Diagnosis not present

## 2018-08-22 DIAGNOSIS — K4090 Unilateral inguinal hernia, without obstruction or gangrene, not specified as recurrent: Secondary | ICD-10-CM | POA: Diagnosis present

## 2018-08-22 DIAGNOSIS — Z66 Do not resuscitate: Secondary | ICD-10-CM | POA: Diagnosis not present

## 2018-08-22 DIAGNOSIS — E871 Hypo-osmolality and hyponatremia: Secondary | ICD-10-CM | POA: Diagnosis present

## 2018-08-22 DIAGNOSIS — I469 Cardiac arrest, cause unspecified: Secondary | ICD-10-CM | POA: Diagnosis not present

## 2018-08-22 DIAGNOSIS — R7989 Other specified abnormal findings of blood chemistry: Secondary | ICD-10-CM | POA: Diagnosis not present

## 2018-08-22 DIAGNOSIS — J9601 Acute respiratory failure with hypoxia: Secondary | ICD-10-CM | POA: Diagnosis not present

## 2018-08-22 DIAGNOSIS — K750 Abscess of liver: Secondary | ICD-10-CM | POA: Diagnosis present

## 2018-08-22 DIAGNOSIS — Z789 Other specified health status: Secondary | ICD-10-CM

## 2018-08-22 DIAGNOSIS — R531 Weakness: Secondary | ICD-10-CM | POA: Diagnosis not present

## 2018-08-22 DIAGNOSIS — I110 Hypertensive heart disease with heart failure: Secondary | ICD-10-CM | POA: Diagnosis present

## 2018-08-22 DIAGNOSIS — K819 Cholecystitis, unspecified: Secondary | ICD-10-CM | POA: Diagnosis not present

## 2018-08-22 DIAGNOSIS — I252 Old myocardial infarction: Secondary | ICD-10-CM

## 2018-08-22 DIAGNOSIS — I714 Abdominal aortic aneurysm, without rupture, unspecified: Secondary | ICD-10-CM | POA: Diagnosis present

## 2018-08-22 DIAGNOSIS — Z8249 Family history of ischemic heart disease and other diseases of the circulatory system: Secondary | ICD-10-CM

## 2018-08-22 DIAGNOSIS — J90 Pleural effusion, not elsewhere classified: Secondary | ICD-10-CM | POA: Diagnosis not present

## 2018-08-22 DIAGNOSIS — K810 Acute cholecystitis: Secondary | ICD-10-CM | POA: Diagnosis not present

## 2018-08-22 DIAGNOSIS — R6521 Severe sepsis with septic shock: Secondary | ICD-10-CM | POA: Diagnosis not present

## 2018-08-22 DIAGNOSIS — N179 Acute kidney failure, unspecified: Secondary | ICD-10-CM | POA: Diagnosis present

## 2018-08-22 DIAGNOSIS — K811 Chronic cholecystitis: Secondary | ICD-10-CM | POA: Diagnosis not present

## 2018-08-22 DIAGNOSIS — I4891 Unspecified atrial fibrillation: Secondary | ICD-10-CM | POA: Diagnosis not present

## 2018-08-22 DIAGNOSIS — K8020 Calculus of gallbladder without cholecystitis without obstruction: Secondary | ICD-10-CM | POA: Diagnosis not present

## 2018-08-22 DIAGNOSIS — Z7902 Long term (current) use of antithrombotics/antiplatelets: Secondary | ICD-10-CM

## 2018-08-22 DIAGNOSIS — M48061 Spinal stenosis, lumbar region without neurogenic claudication: Secondary | ICD-10-CM | POA: Diagnosis not present

## 2018-08-22 DIAGNOSIS — E860 Dehydration: Secondary | ICD-10-CM | POA: Diagnosis not present

## 2018-08-22 DIAGNOSIS — R1011 Right upper quadrant pain: Secondary | ICD-10-CM | POA: Diagnosis not present

## 2018-08-22 DIAGNOSIS — I5022 Chronic systolic (congestive) heart failure: Secondary | ICD-10-CM | POA: Diagnosis present

## 2018-08-22 DIAGNOSIS — R053 Chronic cough: Secondary | ICD-10-CM

## 2018-08-22 DIAGNOSIS — Z452 Encounter for adjustment and management of vascular access device: Secondary | ICD-10-CM | POA: Diagnosis not present

## 2018-08-22 DIAGNOSIS — I700 Atherosclerosis of aorta: Secondary | ICD-10-CM | POA: Diagnosis not present

## 2018-08-22 DIAGNOSIS — G9341 Metabolic encephalopathy: Secondary | ICD-10-CM | POA: Diagnosis present

## 2018-08-22 DIAGNOSIS — R0989 Other specified symptoms and signs involving the circulatory and respiratory systems: Secondary | ICD-10-CM

## 2018-08-22 DIAGNOSIS — I959 Hypotension, unspecified: Secondary | ICD-10-CM | POA: Diagnosis not present

## 2018-08-22 NOTE — Progress Notes (Addendum)
Received patient from [facility] ED via [facility], AOx1, VSS, oriented to room, bed controls and call light, text [facility] admission of patient's arrival to the unit. Pt. Now resting on bed watching TV.

## 2018-08-22 NOTE — Progress Notes (Signed)
Telephone visit  Subjective: CC: fatigue PCP: Allen Fraise, MD [name] is a 78 y.o. male calls for telephone consult today. Patient provides verbal consent for consult held via phone.  Location of patient: home Location of provider: [facility] Others present for call: son, [name]  1.  Weakness/fatigue Much of the history is provided by patient's son, [name].  He reports that he was evaluated in emergency department on the ninth for atypical chest pain.  He had a heart rule out at that time but notes that since he is returned home from that ED evaluation he has progressively gotten weaker, more fatigued and notes that he becomes fatigued after simple tasks like walking around the home.  He has a chronic cough that has not changed.  Denies any fevers.  He has a low appetite and has not been eating as much.  His son has been encouraging him and essentially force-feeding him to keep up his strength.  He denies any lower extremity edema.  He does take chronic Lasix.  Urine output and stool output is normal.  He does have medical history significant for MI and is under the care of cardiology.    ROS: Per HPI  Allergies  Allergen Reactions  . Tamsulosin     Dropped bp too low   Past Medical History:  Diagnosis Date  . Dementia [facility])    Son has POA  . Hypertension   . Myocardial infarction [facility])     Current Outpatient Medications:  .  alfuzosin (UROXATRAL) 10 MG 24 hr tablet, Take 10 mg by mouth at bedtime., Disp: , Rfl:  .  aspirin 81 MG chewable tablet, Chew 1 tablet (81 mg total) by mouth daily., Disp: 90 tablet, Rfl: 3 .  atorvastatin (LIPITOR) 80 MG tablet, TAKE ONE TABLET BY MOUTH DAILY AT 6 PM, Disp: 30 tablet, Rfl: 5 .  BRILINTA 90 MG TABS tablet, Take 90 mg by mouth 2 (two) times daily., Disp: , Rfl:  .  cetirizine (ZYRTEC) 10 MG tablet, Take 10 mg by mouth daily., Disp: , Rfl:  .  donepezil (ARICEPT) 10 MG tablet, TAKE ONE TABLET BY MOUTH AT BEDTIME., Disp: 90  tablet, Rfl: 1 .  esomeprazole (NEXIUM) 20 MG capsule, Take 20 mg by mouth daily., Disp: , Rfl:  .  furosemide (LASIX) 20 MG tablet, Take 20 mg by mouth daily as needed for fluid. , Disp: , Rfl:  .  losartan (COZAAR) 25 MG tablet, Take 25 mg by mouth daily., Disp: , Rfl:  .  memantine (NAMENDA) 10 MG tablet, TAKE ONE TABLET BY MOUTH DAILY., Disp: 90 tablet, Rfl: 1 .  metoprolol succinate (TOPROL-XL) 25 MG 24 hr tablet, TAKE ONE TABLET BY MOUTH DAILY, Disp: 30 tablet, Rfl: 0 .  nitroGLYCERIN (NITROSTAT) 0.4 MG SL tablet, Place 1 tablet (0.4 mg total) under the tongue every 5 (five) minutes as needed. (Patient taking differently: Place 0.4 mg under the tongue every 5 (five) minutes as needed for chest pain. ), Disp: 25 tablet, Rfl: 12 .  tamsulosin (FLOMAX) 0.4 MG CAPS capsule, Take 0.4 mg by mouth daily., Disp: , Rfl:   Dg Chest 2 View  Result Date: 08/12/2018 CLINICAL DATA:  Upper mid chest pain. EXAM: CHEST - 2 VIEW COMPARISON:  January 12, 2011 FINDINGS: Cardiomediastinal silhouette is normal. Mediastinal contours appear intact. Moderate right pleural effusion. Right lower lobe atelectasis versus airspace consolidation. Osseous structures are without acute abnormality. Soft tissues are grossly normal. IMPRESSION: Moderate right pleural effusion with  right lower lobe atelectasis versus airspace consolidation. Electronically Signed   By: [name] M.D.   On: 08/12/2018 15:05   Ct Chest Wo Contrast  Result Date: 08/12/2018 CLINICAL DATA:  Pleural effusion with history of renal cell carcinoma and nephrectomy. Upper abdominal pain x1 day. EXAM: CT CHEST WITHOUT CONTRAST TECHNIQUE: Multidetector CT imaging of the chest was performed following the standard protocol without IV contrast. COMPARISON:  None. FINDINGS: Cardiovascular: The heart size is significantly enlarged. The ascending aorta is ectatic measuring approximately 4.2 cm in diameter. The main pulmonary artery is dilated measuring  approximately 3.2 cm.Coronary artery calcifications are noted. Vascular calcifications are noted of the thoracic aorta. There is no significant pericardial effusion. Mediastinum/Nodes: --No mediastinal or hilar lymphadenopathy. --No axillary lymphadenopathy. --No supraclavicular lymphadenopathy. --Normal thyroid gland. --The esophagus is unremarkable Lungs/Pleura: There is a small right-sided pleural effusion with adjacent chronic appearing atelectasis. There is no pneumothorax. There is no discrete pulmonary nodule. Upper Abdomen: There is diffuse wall thickening of the gallbladder. The patient appears to be status post left nephrectomy. There is a partially visualized soft tissue density measuring approximately 5.1 x 4.9 cm in the left nephrectomy bed. The visualized pancreas and spleen are unremarkable. Musculoskeletal: No chest wall abnormality. No acute or significant osseous findings. Review of the MIP images confirms the above findings. IMPRESSION: 1. Small right-sided pleural effusion with adjacent chronic appearing atelectasis. 2. Diffuse gallbladder wall thickening suspicious for acute cholecystitis. Follow-up with ultrasound is recommended. 3. A 5.1 x 4.9 cm soft tissue density in the left nephrectomy bed. This is only partially visualized. Differential considerations include a postoperative seroma or hematoma. An abscess is not entirely excluded. Residual tumor seems less likely. Follow-up with a contrast-enhanced CT is recommended. 4. Dilated ascending aorta as detailed above. Recommend annual imaging followup by CTA or MRA. This recommendation follows 2010 ACCF/AHA/AATS/ACR/ASA/SCA/SCAI/SIR/STS/SVM Guidelines for the Diagnosis and Management of Patients with Thoracic Aortic Disease. Circulation. 2010; 121: Z610-R604. Aortic aneurysm NOS (ICD10-I71.9) 5. Dilated main pulmonary artery which can be seen in patients with elevated PA pressures. Aortic Atherosclerosis (ICD10-I70.0). Electronically Signed    By: [name] M.D.   On: 08/12/2018 16:56   US Abdomen Limited Ruq  Result Date: 08/13/2018 CLINICAL DATA:  Right upper quadrant abdominal pain EXAM: ULTRASOUND ABDOMEN LIMITED RIGHT UPPER QUADRANT COMPARISON:  05/17/2018 CT FINDINGS: Gallbladder: Layering sludge. The gallbladder is moderately distended without wall thickening or focal tenderness (confirmed with technologist). No shadowing calculi but there were likely small stones by comparison CT. Common bile duct: Diameter: 3 mm. Limited coverage of the duct with no visible filling defect. Liver: No focal lesion identified. Within normal limits in parenchymal echogenicity. Portal vein is patent on color Doppler imaging with normal direction of blood flow towards the liver. Other: Right pleural effusion that is trace where seen. IMPRESSION: Gallbladder sludge with calculi seen on May 2020 abdominal CT. No indication of acute cholecystitis. Electronically Signed   By: Monte Fantasia M.D.   On: 08/13/2018 10:04   Assessment/ Plan: 78 y.o. male   1. Fatigue, unspecified type Uncertain etiology.  I reviewed his recent hospital visit.  I do question possible new onset CHF given elevated BNP level, albeit slightly elevated to 158.  Will recheck BNP given progression of symptoms.  Check CMP, CK, CBC, TSH and CRP.  These will be collected at [facility] so that we can have these done stat.  I have also asked that he go ahead and follow-up with his cardiologist.  May need to consider echocardiogram.  Check chest x-ray to evaluate for fluid.  He had a moderate right-sided pleural effusion during his last ED evaluation.  This was further characterized as a small pleural effusion on CT chest. - Brain natriuretic peptide; Future - CMP14+EGFR; Future - CK; Future - CBC with Differential; Future - TSH; Future - C-reactive protein; Future - CXR 2v  2. Chronic cough He has had negative corona test x2  3. Elevated brain natriuretic peptide  (BNP) level - Brain natriuretic peptide; Future  4. Weakness - CMP14+EGFR; Future - CK; Future - TSH; Future  5. Leukocytosis, unspecified type Uncertain etiology.  Possibly stress related.  If persistently elevated, may need to consider infectious etiology. - CBC with Differential; Future  6. Acute congestive heart failure, unspecified heart failure type ([place]) I question new onset CHF given progression of symptoms and elevated BNP. - Brain natriuretic peptide; Future   Start time: 10:58am End time: 11:06am  Total time spent on patient care (including telephone call/ virtual visit): 20 minutes  [place], [place] [phone]

## 2018-08-23 DIAGNOSIS — K810 Acute cholecystitis: Secondary | ICD-10-CM | POA: Diagnosis present

## 2018-08-23 DIAGNOSIS — I10 Essential (primary) hypertension: Secondary | ICD-10-CM

## 2018-08-23 DIAGNOSIS — G9341 Metabolic encephalopathy: Secondary | ICD-10-CM

## 2018-08-23 DIAGNOSIS — K769 Liver disease, unspecified: Secondary | ICD-10-CM | POA: Diagnosis present

## 2018-08-23 DIAGNOSIS — A0472 Enterocolitis due to Clostridium difficile, not specified as recurrent: Secondary | ICD-10-CM | POA: Diagnosis present

## 2018-08-23 DIAGNOSIS — I714 Abdominal aortic aneurysm, without rupture, unspecified: Secondary | ICD-10-CM | POA: Diagnosis present

## 2018-08-23 DIAGNOSIS — N179 Acute kidney failure, unspecified: Secondary | ICD-10-CM | POA: Diagnosis present

## 2018-08-23 LAB — CBC
HCT: 29.3 % — ABNORMAL LOW (ref 39.0–52.0)
HCT: 29.9 % — ABNORMAL LOW (ref 39.0–52.0)
Hemoglobin: 9.5 g/dL — ABNORMAL LOW (ref 13.0–17.0)
Hemoglobin: 9.7 g/dL — ABNORMAL LOW (ref 13.0–17.0)
MCH: 26.2 pg (ref 26.0–34.0)
MCH: 26.1 pg (ref 26.0–34.0)
MCHC: 32.4 g/dL (ref 30.0–36.0)
MCHC: 32.4 g/dL (ref 30.0–36.0)
MCV: 80.7 fL (ref 80.0–100.0)
MCV: 80.6 fL (ref 80.0–100.0)
Platelets: 152 10*3/uL (ref 150–400)
Platelets: 145 10*3/uL — ABNORMAL LOW (ref 150–400)
RBC: 3.63 MIL/uL — ABNORMAL LOW (ref 4.22–5.81)
RBC: 3.71 MIL/uL — ABNORMAL LOW (ref 4.22–5.81)
RDW: 15.7 % — ABNORMAL HIGH (ref 11.5–15.5)
RDW: 15.8 % — ABNORMAL HIGH (ref 11.5–15.5)
WBC: 10.8 10*3/uL — ABNORMAL HIGH (ref 4.0–10.5)
WBC: 9.4 10*3/uL (ref 4.0–10.5)
nRBC: 0 % (ref 0.0–0.2)
nRBC: 0 % (ref 0.0–0.2)

## 2018-08-23 LAB — COMPREHENSIVE METABOLIC PANEL
ALT: 63 U/L — ABNORMAL HIGH (ref 0–44)
ALT: 63 U/L — ABNORMAL HIGH (ref 0–44)
AST: 108 U/L — ABNORMAL HIGH (ref 15–41)
AST: 105 U/L — ABNORMAL HIGH (ref 15–41)
Albumin: 1.8 g/dL — ABNORMAL LOW (ref 3.5–5.0)
Albumin: 1.7 g/dL — ABNORMAL LOW (ref 3.5–5.0)
Alkaline Phosphatase: 197 U/L — ABNORMAL HIGH (ref 38–126)
Alkaline Phosphatase: 190 U/L — ABNORMAL HIGH (ref 38–126)
Anion gap: 13 (ref 5–15)
Anion gap: 12 (ref 5–15)
BUN: 96 mg/dL — ABNORMAL HIGH (ref 8–23)
BUN: 93 mg/dL — ABNORMAL HIGH (ref 8–23)
CO2: 17 mmol/L — ABNORMAL LOW (ref 22–32)
CO2: 20 mmol/L — ABNORMAL LOW (ref 22–32)
Calcium: 7.9 mg/dL — ABNORMAL LOW (ref 8.9–10.3)
Calcium: 8.1 mg/dL — ABNORMAL LOW (ref 8.9–10.3)
Chloride: 104 mmol/L (ref 98–111)
Chloride: 105 mmol/L (ref 98–111)
Creatinine, Ser: 4.29 mg/dL — ABNORMAL HIGH (ref 0.61–1.24)
Creatinine, Ser: 4.17 mg/dL — ABNORMAL HIGH (ref 0.61–1.24)
GFR calc Af Amer: 15 mL/min — ABNORMAL LOW (ref >60)
GFR calc Af Amer: 15 mL/min — ABNORMAL LOW (ref >60)
GFR calc non Af Amer: 13 mL/min — ABNORMAL LOW (ref >60)
GFR calc non Af Amer: 13 mL/min — ABNORMAL LOW (ref >60)
Glucose, Bld: 77 mg/dL (ref 70–99)
Glucose, Bld: 76 mg/dL (ref 70–99)
Potassium: 3.2 mmol/L — ABNORMAL LOW (ref 3.5–5.1)
Potassium: 2.9 mmol/L — ABNORMAL LOW (ref 3.5–5.1)
Sodium: 134 mmol/L — ABNORMAL LOW (ref 135–145)
Sodium: 137 mmol/L (ref 135–145)
Total Bilirubin: 1.1 mg/dL (ref 0.3–1.2)
Total Bilirubin: 1.5 mg/dL — ABNORMAL HIGH (ref 0.3–1.2)
Total Protein: 5.9 g/dL — ABNORMAL LOW (ref 6.5–8.1)
Total Protein: 6.1 g/dL — ABNORMAL LOW (ref 6.5–8.1)

## 2018-08-23 LAB — LIPASE, BLOOD: Lipase: 216 U/L — ABNORMAL HIGH (ref 11–51)

## 2018-08-23 LAB — PROTIME-INR
INR: 1.3 — ABNORMAL HIGH (ref 0.8–1.2)
Prothrombin Time: 16.3 seconds — ABNORMAL HIGH (ref 11.4–15.2)

## 2018-08-23 LAB — APTT: aPTT: 47 seconds — ABNORMAL HIGH (ref 24–36)

## 2018-08-23 MED ORDER — SODIUM CHLORIDE 0.9 % IV BOLUS
1000.0000 mL | Freq: Once | INTRAVENOUS | Status: AC
  Administered 2018-08-23: 01:00:00 1000 mL via INTRAVENOUS

## 2018-08-23 MED ORDER — HEPARIN SODIUM (PORCINE) 5000 UNIT/ML IJ SOLN
5000.0000 [IU] | Freq: Three times a day (TID) | INTRAMUSCULAR | Status: DC
  Administered 2018-08-23 – 2018-08-24 (×5): 5000 [IU] via SUBCUTANEOUS
  Filled 2018-08-23 (×5): qty 1

## 2018-08-23 MED ORDER — METRONIDAZOLE IN NACL 5-0.79 MG/ML-% IV SOLN: 500.0000 mg | Freq: Three times a day (TID) | INTRAVENOUS | Status: DC

## 2018-08-23 MED ORDER — ATORVASTATIN CALCIUM 80 MG PO TABS
80.0000 mg | ORAL_TABLET | Freq: Every day | ORAL | Status: DC
  Administered 2018-08-24: 80 mg via ORAL
  Filled 2018-08-23 (×2): qty 1

## 2018-08-23 MED ORDER — MEMANTINE HCL 10 MG PO TABS
10.0000 mg | ORAL_TABLET | Freq: Every day | ORAL | Status: DC
  Administered 2018-08-23 – 2018-08-24 (×2): 10 mg via ORAL
  Filled 2018-08-23 (×3): qty 1

## 2018-08-23 MED ORDER — ACETAMINOPHEN 650 MG RE SUPP: 650.0000 mg | Freq: Four times a day (QID) | RECTAL | Status: DC | PRN

## 2018-08-23 MED ORDER — SODIUM CHLORIDE 0.9 % IV SOLN
2.0000 g | INTRAVENOUS | Status: DC
  Filled 2018-08-23: qty 20

## 2018-08-23 MED ORDER — METRONIDAZOLE IN NACL 5-0.79 MG/ML-% IV SOLN
500.0000 mg | Freq: Three times a day (TID) | INTRAVENOUS | Status: DC
  Administered 2018-08-23 – 2018-08-24 (×6): 500 mg via INTRAVENOUS
  Filled 2018-08-23 (×6): qty 100

## 2018-08-23 MED ORDER — POTASSIUM CHLORIDE 10 MEQ/100ML IV SOLN
10.0000 meq | INTRAVENOUS | Status: AC
  Administered 2018-08-23 (×2): 10 meq via INTRAVENOUS
  Filled 2018-08-23 (×2): qty 100

## 2018-08-23 MED ORDER — VANCOMYCIN 50 MG/ML ORAL SOLUTION
125.0000 mg | Freq: Four times a day (QID) | ORAL | Status: DC
  Administered 2018-08-23 – 2018-08-24 (×7): 125 mg via ORAL
  Filled 2018-08-23 (×13): qty 2.5

## 2018-08-23 MED ORDER — SODIUM CHLORIDE 0.9 % IV SOLN
2.0000 g | INTRAVENOUS | Status: DC
  Administered 2018-08-24: 2 g via INTRAVENOUS
  Filled 2018-08-23 (×3): qty 2

## 2018-08-23 MED ORDER — ACETAMINOPHEN 325 MG PO TABS
650.0000 mg | ORAL_TABLET | Freq: Four times a day (QID) | ORAL | Status: DC | PRN
  Administered 2018-08-24: 650 mg via ORAL
  Filled 2018-08-23 (×2): qty 2

## 2018-08-23 MED ORDER — ONDANSETRON HCL 4 MG/2ML IJ SOLN
4.0000 mg | Freq: Four times a day (QID) | INTRAMUSCULAR | Status: DC | PRN
  Administered 2018-08-25: 4 mg via INTRAVENOUS
  Filled 2018-08-23: qty 2

## 2018-08-23 MED ORDER — DONEPEZIL HCL 10 MG PO TABS
10.0000 mg | ORAL_TABLET | Freq: Every day | ORAL | Status: DC
  Administered 2018-08-23 – 2018-08-24 (×2): 10 mg via ORAL
  Filled 2018-08-23 (×2): qty 1

## 2018-08-23 MED ORDER — SODIUM CHLORIDE 0.9 % IV SOLN
2.0000 g | Freq: Two times a day (BID) | INTRAVENOUS | Status: DC
  Administered 2018-08-23: 2 g via INTRAVENOUS
  Filled 2018-08-23 (×2): qty 2

## 2018-08-23 MED ORDER — ASPIRIN 81 MG PO CHEW
81.0000 mg | CHEWABLE_TABLET | Freq: Every day | ORAL | Status: DC
  Administered 2018-08-23 – 2018-08-24 (×2): 81 mg via ORAL
  Filled 2018-08-23 (×2): qty 1

## 2018-08-23 MED ORDER — TICAGRELOR 90 MG PO TABS
90.0000 mg | ORAL_TABLET | Freq: Two times a day (BID) | ORAL | Status: DC
  Administered 2018-08-23 (×2): 90 mg via ORAL
  Filled 2018-08-23 (×2): qty 1

## 2018-08-23 MED ORDER — ONDANSETRON HCL 4 MG PO TABS: 4.0000 mg | ORAL_TABLET | Freq: Four times a day (QID) | ORAL | Status: DC | PRN

## 2018-08-23 MED ORDER — SODIUM CHLORIDE 0.9 % IV SOLN
INTRAVENOUS | Status: DC
  Administered 2018-08-23 – 2018-08-24 (×3): via INTRAVENOUS

## 2018-08-23 NOTE — Progress Notes (Addendum)
[facility] PROGRESS NOTE  [name]. 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  Patient presented to outside facility with a one-week history of abdominal pain nausea decreased oral intake.  Concern was for acute cholecystitis.  Patient was also having diarrhea.  Underwent stool study which was positive for C. difficile.  Patient was transferred here for further management.  Reason for Visit: Acute cholecystitis.  Acute C. difficile diarrhea.  Consultants: General surgery  Procedures: None yet  Antibiotics: Anti-infectives (From admission, onward)   Start     Dose/Rate Route Frequency Ordered Stop   08/23/18 1000  vancomycin (VANCOCIN) 50 mg/mL oral solution 125 mg     125 mg Oral 4 times daily 08/23/18 0013 09/02/18 0959   08/23/18 0800  metroNIDAZOLE (FLAGYL) IVPB 500 mg     500 mg 100 mL/hr over 60 Minutes Intravenous Every 8 hours 08/23/18 0022     08/23/18 0200  ceFEPIme (MAXIPIME) 2 g in sodium chloride 0.9 % 100 mL IVPB     2 g 200 mL/hr over 30 Minutes Intravenous Every 12 hours 08/23/18 0046     08/23/18 0015  cefTRIAXone (ROCEPHIN) 2 g in sodium chloride 0.9 % 100 mL IVPB  Status:  Discontinued     2 g 200 mL/hr over 30 Minutes Intravenous Every 24 hours 08/23/18 0014 08/23/18 0036   08/23/18 0015  metroNIDAZOLE (FLAGYL) IVPB 500 mg  Status:  Discontinued     500 mg 100 mL/hr over 60 Minutes Intravenous Every 8 hours 08/23/18 0014 08/23/18 0022       Subjective/Interval History: Patient remains confused.   Following commands but does not answer questions appropriately.  Could be at his baseline.  ROS: Unable to do due to his dementia  Objective:  Vital Signs  Vitals:   08/10/2018 2309 08/23/18 0546  BP: 127/81 117/74  Pulse: 60 (!) 57  Resp: 18 18  Temp: 98 F (36.7 C) 97.8 F (36.6 C)  TempSrc: Oral Oral  SpO2: 100% 100%    Intake/Output Summary (Last 24 hours) at 08/23/2018 1044 Last data filed at 08/23/2018 0600 Gross per 24 hour  Intake 1634.16 ml  Output -  Net 1634.16 ml   There were no vitals filed for this visit.  General appearance: Lethargic but easily arousable.  In no distress Resp: Clear to auscultation bilaterally.  Normal effort Cardio: S1-S2 is normal regular.  No S3-S4.  No rubs murmurs or bruit GI: Abdomen is soft.  Tenderness mainly in the left lower quadrant without any rebound rigidity or guarding.  Mild tenderness in the right upper quadrant.  Bowel sounds present.  No obvious masses organomegaly.   Extremities: No edema.  Full range of motion of lower extremities. Neurologic: He is lethargic but easily arousable.  Moving all his extremities.  No facial asymmetry.   Lab Results:  Data Reviewed: I have personally reviewed following labs and imaging studies  CBC: Recent Labs  Lab 08/23/18 0333  WBC 10.8*  HGB 9.5*  HCT 29.3*  MCV 80.7  PLT 427    Basic Metabolic Panel: Recent Labs  Lab 08/23/18 [phone]  NA 134*  K 3.2*  CL 104  CO2 17*  GLUCOSE 77  BUN 96*  CREATININE 4.29*  CALCIUM 7.9*    GFR: Estimated Creatinine Clearance: 16.2 mL/min (A) (by C-G formula based on SCr of 4.29 mg/dL (H)).  Liver Function Tests: Recent Labs  Lab 08/23/18 0333  AST 108*  ALT 63*  ALKPHOS 197*  BILITOT 1.1  PROT 5.9*  ALBUMIN 1.8*       Radiology Studies: No results found.   Medications:  Scheduled: . aspirin  81 mg Oral Daily  . atorvastatin  80 mg Oral q1800  . donepezil  10 mg Oral QHS  . heparin  5,000 Units Subcutaneous Q8H   . memantine  10 mg Oral Daily  . ticagrelor  90 mg Oral BID  . vancomycin  125 mg Oral QID   Continuous: . sodium chloride 125 mL/hr at 08/23/18 1044  . ceFEPime (MAXIPIME) IV Stopped (08/23/18 0232)  . metronidazole 500 mg (08/23/18 1045)   WIO:[id number] **OR** acetaminophen, ondansetron **OR** ondansetron (ZOFRAN) IV    Assessment/Plan:  Acute cholecystitis Imaging studies done in the outside facility showed cholelithiasis along with findings concerning for cholecystitis.  Patient had elevated LFTs.  General surgery was consulted.  They are following.  Patient is on cefepime plus Flagyl.  WBC was elevated at 13 in the outside facility.  Noted to be 10.8 this morning.  Continue to monitor labs on a daily basis.  Further management per general surgery.  Acute renal failure/hyponatremia/hypokalemia Most likely prerenal from cholecystitis and CHF.  Patient was given fluid bolus.  Creatinine noted to be 4.29 this morning.  Monitor urine output.  Bladder scan as needed.  May need Foley catheter placement.  Recheck labs tomorrow.  May have to consider renal ultrasound.  Liver lesion A small lesion concerning for an abscess was noted on CT scan.  Continue with antibiotics.  C. difficile diarrhea, present on admission Patient was experiencing loose stools.  A C. difficile test was ordered in the outside facility which was positive.  Patient is currently on oral vancomycin.  Acute metabolic encephalopathy in the setting of dementia CT head done in the outside facility did not show any acute findings.  His encephalopathy is most likely due to acute illness.  Continue to monitor.  Reorient daily.  Continue donepezil.  Also on Namenda.  Essential hypertension Blood pressure medications on hold.  History of AAA Measuring 5.8 cm.  Followed by vascular surgery.  Has not had any intervention due to other medical issues.  Normocytic anemia Drop in hemoglobin likely dilutional.  No evidence  of overt bleeding.  Continue to monitor.  Chronic systolic CHF Echocardiogram done earlier this year showed a EF of 30 to 35%.  Careful with IV fluids.  We will cut back the rate. BNP was noted to be elevated in the outside facility.  But he does not appear to be in CHF at this time.    History of coronary artery disease Last cardiac catheterization was in January 2020.  Severe left main and 2 vessel disease noted.  Only PTCA was performed the RCA.  No stent was placed.  Other disease not amenable to intervention and patient not a candidate for CABG.  Patient is on aspirin and Brilinta.  This will be continued for now unless surgery request that they be discontinued.  Seems to be stable from a cardiac standpoint.   DVT Prophylaxis: Subcutaneous heparin    Code Status: Full code Family Communication: We  will discuss with family Disposition Plan: Management as outlined above.  Mobilize as tolerated.  Wait for acute medical issues to resolve.     LOS: 1 day   [name] [employer] on [email or web address]  08/23/2018, 10:44 AM

## 2018-08-23 NOTE — Progress Notes (Signed)
Patient had not voided since admission around 2330, had 1L NS bolus and with IVF NS at 125 mL/hr, bladder scan only showed 198 mL, not complaining of any discomfort on bladder.  Will monitor and endorse to day shift RN appropriately.

## 2018-08-23 NOTE — Progress Notes (Signed)
CC: Weakness and fatigue  Subjective: Patient referred to [facility] ED by PCP for the above complaints.  Patient subsequently admitted to [facility] after transfer from [facility].  Patient is lying in bed still having loose stools.  The tech just cleaned up after having 2 loose stools.  He is lying there pretty much with his eyes closed and does not seem to have any pain on palpation of his abdomen.  Objective: Vital signs in last 24 hours: Temp:  [97.8 F (36.6 C)-98 F (36.7 C)] 97.8 F (36.6 C) (08/20 0546) Pulse Rate:  [57-60] 57 (08/20 0546) Resp:  [18] 18 (08/20 0546) BP: (117-127)/(74-81) 117/74 (08/20 0546) SpO2:  [100 %] 100 % (08/20 0546) Last BM Date: 08/23/18 30 p.o. 1500 IV Nothing else recorded in the I&O Afebrile vital signs are stable Potassium 3.2/creatinine 4.29.  Labs done at 3 AM this morning.  Ultrasound on 08/13/2018 showed layering sludge, moderate gallbladder distention.  Common bile duct was 3 mm  CT of the abdomen pelvis from [place] without contrast 8/19: Markedly distended gallbladder small gallstones.  Cholecystic edema and inflammation new low-attenuation lesion in the right posterior segment right lobe of the liver adjacent to the inflamed gallbladder measuring 3 x 2 cm no other liver abnormalities.  5.8 cm infrarenal abdominal aortic aneurysm right partially loculated pleural effusion.  Spinal stenosis,  left inguinal hernia with fat. Images are not available on our system, but disc is on the paper chart.   Intake/Output from previous day: 08/19 0701 - 08/20 0700 In: 1634.2 [P.O.:30; I.V.:504.2; IV Piggyback:1100] Out: -  Intake/Output this shift: No intake/output data recorded.  General appearance: He is awake, and follows with you.  I asked him how I felt never really got an answer.  He keeps his eyes closed most of the time I was in the room.  He was clearly tired after having his been cleaned up from 2 loose stools. Resp: clear to auscultation  bilaterally GI: His abdomen is soft and not tender on exam.  Positive bowel sounds and multiple loose stools.  Lab Results:  Recent Labs    08/23/18 0333  WBC 10.8*  HGB 9.5*  HCT 29.3*  PLT 152    BMET Recent Labs    08/23/18 0333  NA 134*  K 3.2*  CL 104  CO2 17*  GLUCOSE 77  BUN 96*  CREATININE 4.29*  CALCIUM 7.9*   PT/INR No results for input(s): LABPROT, INR in the last 72 hours.  Recent Labs  Lab 08/23/18 0333  AST 108*  ALT 63*  ALKPHOS 197*  BILITOT 1.1  PROT 5.9*  ALBUMIN 1.8*     Lipase  No results found for: LIPASE   Medications: . aspirin  81 mg Oral Daily  . atorvastatin  80 mg Oral q1800  . donepezil  10 mg Oral QHS  . heparin  5,000 Units Subcutaneous Q8H  . memantine  10 mg Oral Daily  . ticagrelor  90 mg Oral BID  . vancomycin  125 mg Oral QID   . sodium chloride 125 mL/hr at 08/23/18 0158  . ceFEPime (MAXIPIME) IV Stopped (08/23/18 0232)  . metronidazole    Assessment/Plan C. difficile colitis positive - ED [place] 08/06/2018 Acute kidney failure Dementia -CT negative for acute findings Hx MI -Echo/01/31/2018 EF 30 to 35% on Ticagrelor(Brillenta) Hypertension Left nephrectomy for kidney mass 6 cm abdominal aortic aneurysm  Abdominal pain Acute cholecystitis Possible small hepatic abscess near the gallbladder  -  Markedly  distended gallbladder small gallstones.  Cholecystic edema and              inflammation new low-attenuation lesion in the right posterior segment right          lobe of the liver adjacent to the inflamed gallbladder measuring 3 x 2 cm no           other liver abnormalities.  5.8 cm infrarenal abdominal aortic aneurysm right          partially loculated pleural effusion.    FEN: IV fluids/n.p.o. ID: Cefepime 8/20 >> day 1; Flagyl as ordered but has not been given so far DVT: Heparin Follow-up: To be determined  Plan:   We are seeing to help with the possible cholecystitis.  Will recheck labs at noon and  follow with you.  Defer C Diff to Medicine. We should hold Brillenta, for possible surgery vs IR drain placement.     LOS: 1 day    Willi Borowiak 08/23/[phone]

## 2018-08-23 NOTE — H&P (Signed)
History and Physical    [name]. JKK:[id number] DOB: 06-04-1940 DOA: 08/27/2018  PCP: Claretta Fraise, MD  Patient coming from: Home  I have personally briefly reviewed patient's old medical records in [place]  Chief Complaint: Abd pain  HPI: [name]. is a 78 y.o. male with medical history significant of dementia (son has POA), HTN, CAD s/p angioplasties most recently in Jan, Kidney mass s/p nephrectomy in June, 6cm AAA saw Dr. Donnetta Hutching in June that they wanted to fix once he had recovered from the nephrectomy.  Patient presents to the ED at [facility] with 1 week history of generalized abdominal pain and lethargy.  Nausea, Decreased PO intake.  Seen at [facility] x10 days ago for CP.  Noted to have gallbladder sludge at that time, but not felt to have acute cholecystitis based on RUQ [place] that was also obtained at that time.   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   Review of Systems: Unable due to AMS.  Past Medical History:  Diagnosis Date   Dementia ([place])    Son has POA   Hypertension    Myocardial infarction [facility])     Past Surgical History:  Procedure Laterality Date   CORONARY/GRAFT ACUTE MI REVASCULARIZATION N/A 01/30/2018   Procedure: Coronary/Graft Acute MI Revascularization;  Surgeon: Martinique, Peter M, MD;  Location: [place] CV LAB;  Service: Cardiovascular;  Laterality: N/A;   CYSTOSCOPY WITH RETROGRADE PYELOGRAM, URETEROSCOPY AND STENT PLACEMENT N/A 05/18/2018   Procedure: CYSTOSCOPY;  Surgeon: Lucas Mallow, MD;  Location: [facility];  Service: Urology;  Laterality: N/A;   CYSTOSCOPY WITH URETEROSCOPY AND STENT PLACEMENT Left 06/11/2018   Procedure: CYSTOSCOPY WITH LEFT  URETEROSCOPY   AND STENT PLACEMENT;  Surgeon: Lucas Mallow, MD;  Location: [facility];  Service: Urology;  Laterality: Left;   LAPAROSCOPIC NEPHRECTOMY Left 07/11/2018   Procedure: LEFT HAND ASSISTED LAPAROSCOPIC RADICAL NEPHRECTOMY;  Surgeon: Lucas Mallow, MD;  Location: [facility];  Service: Urology;  Laterality: Left;   LEFT HEART CATH AND CORONARY ANGIOGRAPHY N/A 01/30/2018   Procedure: LEFT HEART CATH AND CORONARY ANGIOGRAPHY;  Surgeon: Martinique, Peter M, MD;  Location: [place] CV LAB;  Service: Cardiovascular;  Laterality: N/A;   TRANSURETHRAL RESECTION OF BLADDER TUMOR N/A 05/18/2018   Procedure: TRANSURETHRAL RESECTION OF BLADDER TUMOR (TURBT), CLOT EVACUATION;  Surgeon: Lucas Mallow, MD;  Location: [facility];  Service: Urology;  Laterality: N/A;     reports that he quit smoking about 30 years ago. His smoking use included cigarettes. He has a 25.00 pack-year smoking history. He has never used smokeless tobacco. He reports previous alcohol use. He reports that he does not use drugs.  Allergies  Allergen Reactions   Tamsulosin     Dropped bp too low    Family History  Problem Relation Age of Onset   Arthritis Mother    Hypertension Mother    Obesity Mother    Stroke Mother    Diabetes Mother    Stroke Father    Pulmonary embolism Sister    Arthritis Son    Asthma Son    Diabetes Son    Obesity Son  Heart disease Paternal Grandfather    Depression Sister      Prior to Admission medications   Medication Sig Start Date End Date Taking? Authorizing Provider  alfuzosin (UROXATRAL) 10 MG 24 hr tablet Take 10 mg by mouth at bedtime. 05/30/18   [provider]  aspirin 81 MG chewable tablet Chew 1 tablet (81 mg total) by mouth daily. 02/03/18   Bhagat, Crista Luria, PA  atorvastatin (LIPITOR) 80 MG tablet TAKE ONE TABLET BY MOUTH DAILY AT 6 PM 07/31/18   Bhagat, Bhavinkumar, PA  BRILINTA 90 MG TABS tablet Take 90 mg by mouth 2 (two) times daily. 08/03/18   [provider]  cetirizine (ZYRTEC) 10 MG tablet Take 10 mg by mouth daily.    [provider]  donepezil (ARICEPT) 10 MG tablet TAKE ONE TABLET BY MOUTH AT BEDTIME. 07/09/18   Claretta Fraise, MD  esomeprazole (NEXIUM) 20 MG capsule Take 20 mg by mouth daily.    [provider]  furosemide (LASIX) 20 MG tablet Take 20 mg by mouth daily as needed for fluid.     [provider]  losartan (COZAAR) 25 MG tablet Take 25 mg by mouth daily. 07/09/18   [provider]  memantine (NAMENDA) 10 MG tablet TAKE ONE TABLET BY MOUTH DAILY. 07/09/18   Claretta Fraise, MD  metoprolol succinate (TOPROL-XL) 25 MG 24 hr tablet TAKE ONE TABLET BY MOUTH DAILY 07/31/18   Bhagat, Bhavinkumar, PA  nitroGLYCERIN (NITROSTAT) 0.4 MG SL tablet Place 1 tablet (0.4 mg total) under the tongue every 5 (five) minutes as needed. Patient taking differently: Place 0.4 mg under the tongue every 5 (five) minutes as needed for chest pain.  02/02/18   Bhagat, Crista Luria, PA  tamsulosin (FLOMAX) 0.4 MG CAPS capsule Take 0.4 mg by mouth daily. 07/09/18   [provider]    Physical Exam: Vitals:   08/24/2018 2309  BP: 127/81  Pulse: 60  Resp: 18  Temp: 98 F (36.7 C)  TempSrc: Oral  SpO2: 100%    Constitutional: NAD, calm, comfortable Eyes: PERRL, lids and conjunctivae normal ENMT: Mucous membranes are moist. Posterior pharynx clear of any exudate or lesions.Normal dentition.  Neck: normal, supple, no masses, no thyromegaly Respiratory: clear to auscultation bilaterally, no wheezing, no crackles. Normal respiratory effort. No accessory muscle use.  Cardiovascular: Regular rate and rhythm, no murmurs / rubs / gallops. No extremity edema. 2+ pedal pulses. No carotid bruits.  Abdomen: Generalized TTP, no rebound Musculoskeletal: no clubbing / cyanosis. No joint deformity upper and lower extremities. Good ROM, no contractures. Normal muscle tone.  Skin: no rashes, lesions, ulcers. No  induration Neurologic: Eye opening to voice, makes eye contact with me, doesn't answer any questions verbally, falls back asleep. Psychiatric: Unable to assess due to AMS   Labs on Admission: I have personally reviewed following labs and imaging studies  CBC: No results for input(s): WBC, NEUTROABS, HGB, HCT, MCV, PLT in the last 168 hours. Basic Metabolic Panel: No results for input(s): NA, K, CL, CO2, GLUCOSE, BUN, CREATININE, CALCIUM, MG, PHOS in the last 168 hours. GFR: Estimated Creatinine Clearance: 53.5 mL/min (A) (by C-G formula based on SCr of 1.3 mg/dL (H)). Liver Function Tests: No results for input(s): AST, ALT, ALKPHOS, BILITOT, PROT, ALBUMIN in the last 168 hours. No results for input(s): LIPASE, AMYLASE in the last 168 hours. No results for input(s): AMMONIA in the last 168 hours. Coagulation Profile: No results for input(s): INR, PROTIME in the last 168 hours. Cardiac  Enzymes: No results for input(s): CKTOTAL, CKMB, CKMBINDEX, TROPONINI in the last 168 hours. BNP (last 3 results) No results for input(s): PROBNP in the last 8760 hours. HbA1C: No results for input(s): HGBA1C in the last 72 hours. CBG: No results for input(s): GLUCAP in the last 168 hours. Lipid Profile: No results for input(s): CHOL, HDL, LDLCALC, TRIG, CHOLHDL, LDLDIRECT in the last 72 hours. Thyroid Function Tests: No results for input(s): TSH, T4TOTAL, FREET4, T3FREE, THYROIDAB in the last 72 hours. Anemia Panel: No results for input(s): VITAMINB12, FOLATE, FERRITIN, TIBC, IRON, RETICCTPCT in the last 72 hours. Urine analysis: No results found for: COLORURINE, APPEARANCEUR, LABSPEC, PHURINE, GLUCOSEU, HGBUR, BILIRUBINUR, KETONESUR, PROTEINUR, UROBILINOGEN, NITRITE, LEUKOCYTESUR  Radiological Exams on Admission: No results found.  EKG: Independently reviewed.  Assessment/Plan Principal Problem:   Acute cholecystitis Active Problems:   Essential hypertension   Acute kidney failure (HCC)    Clostridium difficile diarrhea   AAA (abdominal aortic aneurysm) (HCC)   Liver lesion   Acute metabolic encephalopathy    1. Acute cholecystitis - Overall concerning for ascending cholangitis now given also has AKF and has developed AMS too.  No hypotension / shock though at this time. 1. Gen surg will see in AM, spoke with Dr. Rosendo Gros 2. ABx: got zosyn in ED, will put on Cefepime + Flagyl for cholecystitis 3. WBC elevated 13k but no other SIRS at this time 4. NPO 5. Repeat CBC/ CMP in AM 2. Liver lesion - 1. Question hepatic abscess given location 2. ABx as above initially 3. surg eval as above 3. AKF - 1. Due to prerenal from cholecystitis and C.Diff 2. IVF: 1L bolus now then finish the NS+40k at 125 cc/hr bag then NS at 125 cc/hr 3. Strict intake and output 4. C.Diff diarrhea - 1. Didn't see much colitis on the CT but had liqiuid stool 2. PO vanc ordered, and is already on IV flagyl as above 5. Acute metabolic encephalopathy - 1. Presumably due to combination of above acute illness(es). 6. HTN - holding home BP meds 7. AAA - 1. 5.8 cm 2. See consult note from [place] in June 3. Needs to be repaired once acute issues resolved 8. CAD - 1. Angioplasties, looks like without stents, performed in Jan 2. Will continue ASA / Brilinta for the moment (Did give Dr. Rosendo Gros a heads up on this, he didn't seem to suggest that they absolutely needed to be held).  DVT prophylaxis: Heparin [place] Code Status: Full Family Communication: No family in room Disposition Plan: Home after admit Consults called: Dr. Rosendo Gros - they will see in AM and decide on course Admission status: Admit to inpatient  Severity of Illness: The appropriate patient status for this patient is INPATIENT. Inpatient status is judged to be reasonable and necessary in order to provide the required intensity of service to ensure the patient's safety. The patient's presenting symptoms, physical exam findings, and initial  radiographic and laboratory data in the context of their chronic comorbidities is felt to place them at high risk for further clinical deterioration. Furthermore, it is not anticipated that the patient will be medically stable for discharge from the hospital within 2 midnights of admission. The following factors support the patient status of inpatient.   IP status for acute cholecystis, acute kidney failure, acute encephalopathy.   * I certify that at the point of admission it is my clinical judgment that the patient will require inpatient hospital care spanning beyond 2 midnights from the point of admission  due to high intensity of service, high risk for further deterioration and high frequency of surveillance required.*    Brandy Zuba M. DO [facility]  How to contact the [facility] Attending or Consulting provider [place] or covering provider during after hours [place], for this patient?  1. Check the care team in [facility] and look for a) attending/consulting [facility] provider listed and b) the [facility] team listed 2. Log into [email or web address]  Amion Physician Scheduling and messaging for groups and whole hospitals  On call and physician scheduling software for group practices, residents, hospitalists and other medical providers for call, clinic, rotation and shift schedules. [employer] is a hospital-wide system for scheduling doctors and paging doctors on call. Easy[employer] is for scientific plotting and data analysis.  [email or web address]  and use [place]'s universal password to access. If you do not have the password, please contact the hospital operator.  3. Locate the [facility] provider you are looking for under [facility] and page to a number that you can be directly reached. 4. If you still have difficulty reaching the provider, please page the [facility] (Director on Call) for the Hospitalists listed on amion for assistance.  08/23/2018, 12:30 AM

## 2018-08-23 NOTE — Consult Note (Signed)
Reason for Consult: Cholecystitis Referring Physician: Dr. Burna Cash. is an 78 y.o. male.  HPI: Patient is a 78 year old male with a history of dementia, hypertension, and previous MIs, history of a 6 cm AAA, history of left nephrectomy in April for a kidney mass, Who presented at outside hospital yesterday secondary to abdominal pain.  Patient's mental confusion due to dementia is fairly significant.  History is obtained mainly through the chart.  Patient apparently was recently in the ER 10 days ago and underwent work-up for chest pain.  Patient did have an ultrasound and was found to have gallbladder sludge without any signs of cholecystitis.  Patient states he has some abdominal pain.  Patient is unsure if he has had any nausea or vomiting.  Patient is unsure of the timing.  Upon evaluation in the outside ER patient was found to have elevated transaminases and elevated alk phos.  Patient also had what appeared to be a small hepatic abscess near the gallbladder.  Patient was having some liquid stool and tested positive for C. difficile.  Patient also was in acute renal failure with a creatinine of 4.5 and a BUN of 90.  Patient had a leukocytosis of 13,000.  Secondary to the above general surgery was consulted for further evaluation and management  Past Medical History:  Diagnosis Date  . Dementia [facility])    Son has POA  . Hypertension   . Myocardial infarction [facility])     Past Surgical History:  Procedure Laterality Date  . CORONARY/GRAFT ACUTE MI REVASCULARIZATION N/A 01/30/2018   Procedure: Coronary/Graft Acute MI Revascularization;  Surgeon: Martinique, Peter M, MD;  Location: [place] [facility];  Service: Cardiovascular;  Laterality: N/A;  . CYSTOSCOPY WITH RETROGRADE PYELOGRAM, URETEROSCOPY AND STENT PLACEMENT N/A 05/18/2018   Procedure: CYSTOSCOPY;  Surgeon: Lucas Mallow, MD;  Location: [facility];  Service: Urology;  Laterality: N/A;  . CYSTOSCOPY WITH URETEROSCOPY AND STENT  PLACEMENT Left 06/11/2018   Procedure: CYSTOSCOPY WITH LEFT  URETEROSCOPY  AND STENT PLACEMENT;  Surgeon: Lucas Mallow, MD;  Location: [facility];  Service: Urology;  Laterality: Left;  . LAPAROSCOPIC NEPHRECTOMY Left 07/11/2018   Procedure: LEFT HAND ASSISTED LAPAROSCOPIC RADICAL NEPHRECTOMY;  Surgeon: Lucas Mallow, MD;  Location: [facility];  Service: Urology;  Laterality: Left;  . LEFT HEART CATH AND CORONARY ANGIOGRAPHY N/A 01/30/2018   Procedure: LEFT HEART CATH AND CORONARY ANGIOGRAPHY;  Surgeon: Martinique, Peter M, MD;  Location: [place] [facility];  Service: Cardiovascular;  Laterality: N/A;  . TRANSURETHRAL RESECTION OF BLADDER TUMOR N/A 05/18/2018   Procedure: TRANSURETHRAL RESECTION OF BLADDER TUMOR (TURBT), CLOT EVACUATION;  Surgeon: Lucas Mallow, MD;  Location: [facility];  Service: Urology;  Laterality: N/A;    Family History  Problem Relation Age of Onset  . Arthritis Mother   . Hypertension Mother   . Obesity Mother   . Stroke Mother   . Diabetes Mother   . Stroke Father   . Pulmonary embolism Sister   . Arthritis Son   . Asthma Son   . Diabetes Son   . Obesity Son   . Heart disease Paternal Grandfather   . Depression Sister     Social History:  reports that he quit smoking about 30 years ago. His smoking use included cigarettes. He has a 25.00 pack-year smoking history. He has never used smokeless tobacco. He reports previous alcohol use. He reports that he does not use drugs.  Allergies:  Allergies  Allergen Reactions  . Tamsulosin     Dropped bp too low    Medications: I have reviewed the patient's current medications.  Results for orders placed or performed during the hospital encounter of 08/08/2018 (from the past 48 hour(s))  Comprehensive metabolic panel     Status: Abnormal   Collection Time: 08/23/18  3:33 AM  Result Value Ref Range   Sodium 134 (L) 135 - 145 mmol/L   Potassium 3.2 (L) 3.5 - 5.1 mmol/L   Chloride 104 98 - 111 mmol/L   CO2 17 (L) 22 -  32 mmol/L   Glucose, Bld 77 70 - 99 mg/dL   BUN 96 (H) 8 - 23 mg/dL   Creatinine, Ser 4.29 (H) 0.61 - 1.24 mg/dL   Calcium 7.9 (L) 8.9 - 10.3 mg/dL   Total Protein 5.9 (L) 6.5 - 8.1 g/dL   Albumin 1.8 (L) 3.5 - 5.0 g/dL   AST 108 (H) 15 - 41 U/L   ALT 63 (H) 0 - 44 U/L   Alkaline Phosphatase 197 (H) 38 - 126 U/L   Total Bilirubin 1.1 0.3 - 1.2 mg/dL   GFR calc non Af Amer 13 (L) >60 mL/min   GFR calc Af Amer 15 (L) >60 mL/min   Anion gap 13 5 - 15    Comment: Performed at [facility], [address] [place], [place] [address]  CBC     Status: Abnormal   Collection Time: 08/23/18  3:33 AM  Result Value Ref Range   WBC 10.8 (H) 4.0 - 10.5 K/uL   RBC 3.63 (L) 4.22 - 5.81 MIL/uL   Hemoglobin 9.5 (L) 13.0 - 17.0 g/dL   HCT 29.3 (L) 39.0 - 52.0 %   MCV 80.7 80.0 - 100.0 fL   MCH 26.2 26.0 - 34.0 pg   MCHC 32.4 30.0 - 36.0 g/dL   RDW 15.7 (H) 11.5 - 15.5 %   Platelets 152 150 - 400 K/uL   nRBC 0.0 0.0 - 0.2 %    Comment: Performed at [facility], [place] [address], [place], [place] [address]    No results found.  Review of Systems  Constitutional: Negative for chills, fever and malaise/fatigue.  HENT: Negative for ear discharge, hearing loss and sore throat.   Eyes: Negative for blurred vision and discharge.  Respiratory: Negative for cough and shortness of breath.   Cardiovascular: Negative for chest pain, orthopnea and leg swelling.  Gastrointestinal: Positive for abdominal pain. Negative for constipation, diarrhea, heartburn, nausea and vomiting.  Musculoskeletal: Negative for myalgias and neck pain.  Skin: Negative for itching and rash.  Neurological: Negative for dizziness, focal weakness, seizures and loss of consciousness.  Endo/Heme/Allergies: Negative for environmental allergies. Does not bruise/bleed easily.  Psychiatric/Behavioral: Negative for depression and suicidal ideas.  All other systems reviewed and are negative.  Blood pressure 117/74, pulse (!)  57, temperature 97.8 F (36.6 C), temperature source Oral, resp. rate 18, SpO2 100 %. Physical Exam  Constitutional: He is oriented to person, place, and time. Vital signs are normal. He appears well-developed and well-nourished.  Conversant No acute distress  HENT:  Head: Normocephalic and atraumatic.  Eyes: Pupils are equal, round, and reactive to light. Conjunctivae and lids are normal. No scleral icterus.  No lid lag Moist conjunctiva  Neck: Normal range of motion. Neck supple. No tracheal tenderness present. No thyromegaly present.  No cervical lymphadenopathy  Cardiovascular: Normal rate, regular rhythm and intact distal pulses.  No  murmur heard. Respiratory: Effort normal and breath sounds normal. He has no wheezes. He has no rales.  GI: Soft. Bowel sounds are normal. There is no hepatosplenomegaly. There is abdominal tenderness (with deep palpation). No hernia.  Musculoskeletal: Normal range of motion.  Neurological: He is alert and oriented to person, place, and time.  Normal gait and station  Skin: Skin is warm. No rash noted. No cyanosis. Nails show no clubbing.  Normal skin turgor  Psychiatric: Judgment normal.  Appropriate affect    Assessment/Plan: 78 year old male with likely acute cholecystitis History of dementia Hypertension CAD Kidney mass status post nephrectomy AAA  1.  Recommend treating with antibiotics at this time. 2.  We will repeat the LFTs in the a.m. and determine whether or not the patient is better suited for cholecystostomy tube. 3.  We will follow along.  Ralene Ok 08/23/2018, 6:28 AM

## 2018-08-24 LAB — COMPREHENSIVE METABOLIC PANEL
ALT: 54 U/L — ABNORMAL HIGH (ref 0–44)
AST: 89 U/L — ABNORMAL HIGH (ref 15–41)
Albumin: 1.7 g/dL — ABNORMAL LOW (ref 3.5–5.0)
Alkaline Phosphatase: 179 U/L — ABNORMAL HIGH (ref 38–126)
Anion gap: 13 (ref 5–15)
BUN: 90 mg/dL — ABNORMAL HIGH (ref 8–23)
CO2: 16 mmol/L — ABNORMAL LOW (ref 22–32)
Calcium: 8.3 mg/dL — ABNORMAL LOW (ref 8.9–10.3)
Chloride: 109 mmol/L (ref 98–111)
Creatinine, Ser: 3.72 mg/dL — ABNORMAL HIGH (ref 0.61–1.24)
GFR calc Af Amer: 17 mL/min — ABNORMAL LOW (ref >60)
GFR calc non Af Amer: 15 mL/min — ABNORMAL LOW (ref >60)
Glucose, Bld: 64 mg/dL — ABNORMAL LOW (ref 70–99)
Potassium: 3.1 mmol/L — ABNORMAL LOW (ref 3.5–5.1)
Sodium: 138 mmol/L (ref 135–145)
Total Bilirubin: 1.5 mg/dL — ABNORMAL HIGH (ref 0.3–1.2)
Total Protein: 6 g/dL — ABNORMAL LOW (ref 6.5–8.1)

## 2018-08-24 LAB — GLUCOSE, CAPILLARY
Glucose-Capillary: 55 mg/dL — ABNORMAL LOW (ref 70–99)
Glucose-Capillary: 64 mg/dL — ABNORMAL LOW (ref 70–99)
Glucose-Capillary: 187 mg/dL — ABNORMAL HIGH (ref 70–99)
Glucose-Capillary: 72 mg/dL (ref 70–99)
Glucose-Capillary: 101 mg/dL — ABNORMAL HIGH (ref 70–99)

## 2018-08-24 LAB — CBC
HCT: 28.8 % — ABNORMAL LOW (ref 39.0–52.0)
Hemoglobin: 9.3 g/dL — ABNORMAL LOW (ref 13.0–17.0)
MCH: 26.2 pg (ref 26.0–34.0)
MCHC: 32.3 g/dL (ref 30.0–36.0)
MCV: 81.1 fL (ref 80.0–100.0)
Platelets: 133 10*3/uL — ABNORMAL LOW (ref 150–400)
RBC: 3.55 MIL/uL — ABNORMAL LOW (ref 4.22–5.81)
RDW: 16 % — ABNORMAL HIGH (ref 11.5–15.5)
WBC: 7.7 10*3/uL (ref 4.0–10.5)
nRBC: 0 % (ref 0.0–0.2)

## 2018-08-24 LAB — LIPASE, BLOOD: Lipase: 252 U/L — ABNORMAL HIGH (ref 11–51)

## 2018-08-24 MED ORDER — DEXTROSE-NACL 5-0.45 % IV SOLN
INTRAVENOUS | Status: DC
  Administered 2018-08-24: 08:00:00 via INTRAVENOUS

## 2018-08-24 MED ORDER — SODIUM BICARBONATE 650 MG PO TABS
650.0000 mg | ORAL_TABLET | Freq: Two times a day (BID) | ORAL | Status: DC
  Administered 2018-08-24 (×2): 650 mg via ORAL
  Filled 2018-08-24 (×2): qty 1

## 2018-08-24 MED ORDER — POTASSIUM CHLORIDE 10 MEQ/100ML IV SOLN
10.0000 meq | INTRAVENOUS | Status: AC
  Administered 2018-08-24 (×2): 10 meq via INTRAVENOUS
  Filled 2018-08-24 (×2): qty 100

## 2018-08-24 MED ORDER — DEXTROSE 50 % IV SOLN
1.0000 | Freq: Once | INTRAVENOUS | Status: AC
  Administered 2018-08-24: 15:00:00 50 mL via INTRAVENOUS
  Filled 2018-08-24: qty 50

## 2018-08-24 MED FILL — Dextrose Inj 50%: INTRAVENOUS | Qty: 50 | Status: AC

## 2018-08-24 NOTE — Progress Notes (Signed)
CC: Weakness and fatigue  Subjective: Says he feels bad all over.  Asking for a drink of water.  He has no tenderness or pain on the abdomen on exam today.  Objective: Vital signs in last 24 hours: Temp:  [97.4 F (36.3 C)-97.7 F (36.5 C)] 97.6 F (36.4 C) (08/21 0606) Pulse Rate:  [50-57] 57 (08/21 0606) Resp:  [16-18] 16 (08/21 0606) BP: (111-149)/(69-74) 149/74 (08/21 0606) SpO2:  [100 %] 100 % (08/21 0606) Last BM Date: 08/23/18 N.p.o. 2217 IV 550 urine recorded BM x3 recorded Afebrile vital signs are stable Potassium is 3.1, creatinine down to 3.72, lipase still elevated at 252.   LFTs mildly elevated but stable. WBC 7.7, H/H stable: 9.3/28.8 Platelets down to 133K Intake/Output from previous day: 08/20 0701 - 08/21 0700 In: 2217.3 [I.V.:1622.9; IV Piggyback:594.4] Out: 550 [Urine:550] Intake/Output this shift: No intake/output data recorded.  General appearance: alert, cooperative and Frail, and weak.  He is asking for something to drink.  Clearly feels bad GI: His abdomen is soft nontender to palpation even in the right upper quadrant.  Diarrhea may be improving.  Lab Results:  Recent Labs    08/23/18 1127 08/24/18 0432  WBC 9.4 7.7  HGB 9.7* 9.3*  HCT 29.9* 28.8*  PLT 145* 133*    BMET Recent Labs    08/23/18 1127 08/24/18 0432  NA 137 138  K 2.9* 3.1*  CL 105 109  CO2 20* 16*  GLUCOSE 76 64*  BUN 93* 90*  CREATININE 4.17* 3.72*  CALCIUM 8.1* 8.3*   PT/INR Recent Labs    08/23/18 1127  LABPROT 16.3*  INR 1.3*    Recent Labs  Lab 08/23/18 0333 08/23/18 1127 08/24/18 0432  AST 108* 105* 89*  ALT 63* 63* 54*  ALKPHOS 197* 190* 179*  BILITOT 1.1 1.5* 1.5*  PROT 5.9* 6.1* 6.0*  ALBUMIN 1.8* 1.7* 1.7*     Lipase     Component Value Date/Time   LIPASE 252 (H) 08/24/2018 0432     Medications: . aspirin  81 mg Oral Daily  . atorvastatin  80 mg Oral q1800  . donepezil  10 mg Oral QHS  . heparin  5,000 Units Subcutaneous  Q8H  . memantine  10 mg Oral Daily  . sodium bicarbonate  650 mg Oral BID  . vancomycin  125 mg Oral QID   . ceFEPime (MAXIPIME) IV Stopped (08/24/18 0352)  . dextrose 5 % and 0.45% NaCl 75 mL/hr at 08/24/18 0801  . metronidazole 500 mg (08/24/18 0805)  . potassium chloride 10 mEq (08/24/18 0804)   Anti-infectives (From admission, onward)   Start     Dose/Rate Route Frequency Ordered Stop   08/24/18 0202  ceFEPIme (MAXIPIME) 2 g in sodium chloride 0.9 % 100 mL IVPB     2 g 200 mL/hr over 30 Minutes Intravenous Every 24 hours 08/23/18 1303     08/23/18 1000  vancomycin (VANCOCIN) 50 mg/mL oral solution 125 mg     125 mg Oral 4 times daily 08/23/18 0013 09/02/18 0959   08/23/18 0800  metroNIDAZOLE (FLAGYL) IVPB 500 mg     500 mg 100 mL/hr over 60 Minutes Intravenous Every 8 hours 08/23/18 0022     08/23/18 0200  ceFEPIme (MAXIPIME) 2 g in sodium chloride 0.9 % 100 mL IVPB  Status:  Discontinued     2 g 200 mL/hr over 30 Minutes Intravenous Every 12 hours 08/23/18 0046 08/23/18 1303   08/23/18 0015  cefTRIAXone (  ROCEPHIN) 2 g in sodium chloride 0.9 % 100 mL IVPB  Status:  Discontinued     2 g 200 mL/hr over 30 Minutes Intravenous Every 24 hours 08/23/18 0014 08/23/18 0036   08/23/18 0015  metroNIDAZOLE (FLAGYL) IVPB 500 mg  Status:  Discontinued     500 mg 100 mL/hr over 60 Minutes Intravenous Every 8 hours 08/23/18 0014 08/23/18 0022      Assessment/Plan C. difficile colitis positive - ED [place] 08/06/2018 Acute kidney failure Dementia -CT negative for acute findings Hx MI -Echo/01/31/2018 EF 30 to 35% on Ticagrelor(Brillenta) Hypertension Left nephrectomy for kidney mass 6 cm abdominal aortic aneurysm  Abdominal pain Acute cholecystitis Possible small hepatic abscess near the gallbladder  -  Markedly distended gallbladder small gallstones.  Cholecystic edema and inflammation new low-attenuation lesion in the right posterior segment right lobe of the liver adjacent to the  inflamed gallbladder measuring 3 x 2 cm no other liver abnormalities.  5.8 cm infrarenal abdominal aortic aneurysm right partially loculated pleural effusion.  Lipase 216(8/20) >>252(8/21)   FEN: IV fluids/n.p.o. ID: Cefepime 8/20 >> day 2; Flagyl 8/20 >> day 2; oral vancomycin 8/20>> day 2 DVT: Heparin/ Brillenta:  Last dose 10:45 AM 08/23/18 Follow-up: To be determined   Plan: Discussed with Dr. [place] Pink.  Recommend continued ongoing conservative medical management with antibiotics.  Give him some ice chips and sips, another for oral comfort.  He appears to have some pancreatitis, WBC is improving and he is currently asymptomatic on exam from this.    LOS: 2 days    Ramiro Pangilinan 08/24/2018 [phone]

## 2018-08-24 NOTE — Progress Notes (Addendum)
[facility] PROGRESS NOTE  [name]. [id number] DOB: Sep 19, 1940 DOA: 08/11/2018  PCP: Claretta Fraise, MD  Brief History/Interval Summary: 78 year old Caucasian male with a past medical history of dementia, essential hypertension, coronary artery disease status post angioplasty most recently in January for RCA lesion, other lesions not amenable to intervention, not a candidate for CABG, history of kidney mass status post nephrectomy in June, history of abdominal aortic aneurysm measuring 6 cm followed by vascular surgery.  Patient presented to outside facility with a one-week history of abdominal pain nausea decreased oral intake.  Concern was for acute cholecystitis.  Patient was also having diarrhea.  Underwent stool study which was positive for C. difficile.  Patient was transferred here for further management.  Reason for Visit: Acute cholecystitis.  Acute C. difficile diarrhea.  Consultants: General surgery  Procedures: None yet  Antibiotics: Anti-infectives (From admission, onward)   Start     Dose/Rate Route Frequency Ordered Stop   08/24/18 0202  ceFEPIme (MAXIPIME) 2 g in sodium chloride 0.9 % 100 mL IVPB     2 g 200 mL/hr over 30 Minutes Intravenous Every 24 hours 08/23/18 1303     08/23/18 1000  vancomycin (VANCOCIN) 50 mg/mL oral solution 125 mg     125 mg Oral 4 times daily 08/23/18 0013 09/02/18 0959   08/23/18 0800  metroNIDAZOLE (FLAGYL) IVPB 500 mg     500 mg 100 mL/hr over 60 Minutes Intravenous Every 8 hours 08/23/18 0022     08/23/18 0200  ceFEPIme (MAXIPIME) 2 g in sodium chloride 0.9 % 100 mL IVPB  Status:  Discontinued     2 g 200 mL/hr over 30 Minutes Intravenous Every 12 hours 08/23/18 0046 08/23/18 1303   08/23/18 0015  cefTRIAXone (ROCEPHIN) 2 g in sodium chloride 0.9 % 100 mL IVPB  Status:  Discontinued     2 g 200 mL/hr over 30 Minutes Intravenous Every 24 hours 08/23/18 0014 08/23/18 0036   08/23/18 0015  metroNIDAZOLE (FLAGYL) IVPB  500 mg  Status:  Discontinued     500 mg 100 mL/hr over 60 Minutes Intravenous Every 8 hours 08/23/18 0014 08/23/18 0022       Subjective/Interval History: Patient remains confused and distracted.  Asking for water this morning.  Denies any pain.    ROS: Unable to do due to his dementia  Objective:  Vital Signs  Vitals:   08/23/18 0546 08/23/18 1344 08/23/18 2124 08/24/18 0606  BP: 117/74 111/69 137/69 (!) 149/74  Pulse: (!) 57 (!) 51 (!) 50 (!) 57  Resp: 18 18 16 16   Temp: 97.8 F (36.6 C) (!) 97.4 F (36.3 C) 97.7 F (36.5 C) 97.6 F (36.4 C)  TempSrc: Oral Oral Oral Oral  SpO2: 100% 100% 100% 100%    Intake/Output Summary (Last 24 hours) at 08/24/2018 1121 Last data filed at 08/24/2018 0400 Gross per 24 hour  Intake 1600.32 ml  Output 550 ml  Net 1050.32 ml   There were no vitals filed for this visit.   General appearance: Distracted.  Lethargic but easily arousable.  In no distress. Resp: Clear to auscultation bilaterally.  Normal effort Cardio: S1-S2 is normal regular.  No S3-S4.  No rubs murmurs or bruit GI: Abdomen is soft.  Nontender today.  Specifically no tenderness in the right upper quadrant.  No tenderness in the left lower quadrant as was noticed yesterday.  No obvious masses or organomegaly.  Bowel sounds present.   Extremities: No edema.  Full  range of motion of lower extremities. Neurologic: Does not be confused.  Moving all his extremities.  No facial asymmetry.   Lab Results:  Data Reviewed: I have personally reviewed following labs and imaging studies  CBC: Recent Labs  Lab 08/23/18 0333 08/23/18 1127 08/24/18 0432  WBC 10.8* 9.4 7.7  HGB 9.5* 9.7* 9.3*  HCT 29.3* 29.9* 28.8*  MCV 80.7 80.6 81.1  PLT 152 145* 133*    Basic Metabolic Panel: Recent Labs  Lab 08/23/18 0333 08/23/18 1127 08/24/18 0432  NA 134* 137 138  K 3.2* 2.9* 3.1*  CL 104 105 109  CO2 17* 20* 16*  GLUCOSE 77 76 64*  BUN 96* 93* 90*  CREATININE 4.29* 4.17*  3.72*  CALCIUM 7.9* 8.1* 8.3*    GFR: Estimated Creatinine Clearance: 18.7 mL/min (A) (by C-G formula based on SCr of 3.72 mg/dL (H)).  Liver Function Tests: Recent Labs  Lab 08/23/18 0333 08/23/18 1127 08/24/18 0432  AST 108* 105* 89*  ALT 63* 63* 54*  ALKPHOS 197* 190* 179*  BILITOT 1.1 1.5* 1.5*  PROT 5.9* 6.1* 6.0*  ALBUMIN 1.8* 1.7* 1.7*   Lipase     Component Value Date/Time   LIPASE 252 (H) 08/24/2018 0432       Radiology Studies: No results found.   Medications:  Scheduled: . aspirin  81 mg Oral Daily  . atorvastatin  80 mg Oral q1800  . donepezil  10 mg Oral QHS  . heparin  5,000 Units Subcutaneous Q8H  . memantine  10 mg Oral Daily  . sodium bicarbonate  650 mg Oral BID  . vancomycin  125 mg Oral QID   Continuous: . ceFEPime (MAXIPIME) IV Stopped (08/24/18 0352)  . dextrose 5 % and 0.45% NaCl 75 mL/hr at 08/24/18 0801  . metronidazole 500 mg (08/24/18 0805)   [id number] **OR** acetaminophen, ondansetron **OR** ondansetron (ZOFRAN) IV    Assessment/Plan:  Acute cholecystitis Imaging studies done in the outside facility showed cholelithiasis along with findings concerning for cholecystitis.  Patient had elevated LFTs.  General surgery was consulted.  General surgery continues to follow.  LFTs remain elevated though slightly better.  Bilirubin mildly elevated along with alkaline phosphatase and AST and ALT.  WBC was elevated in the outside facility.  Improved here.  General surgery is still considering percutaneous drainage.  Patient is on cefepime plus Flagyl.    Acute renal failure/hyponatremia/hypokalemia Most likely prerenal from cholecystitis and CHF.  Patient was given fluids.  Renal function slowly improving.  Creatinine down to 3.72.  BUN 19.  Potassium slightly better at 3.1.  Additional supplementation to be given today.  Monitor urine output.  Hold off on renal ultrasound as creatinine is improving.   Liver lesion A small lesion  concerning for an abscess was noted on CT scan.  Continue with antibiotics.  C. difficile diarrhea, present on admission Patient was experiencing loose stools.  A C. difficile test was ordered in the outside facility which was positive.  Monitor stool output.  Abdomen is benign.  Continue oral vancomycin.  Hypoglycemia Low glucose level noted on blood work this morning.  IV fluids were changed to include D5.  Will repeat CBG.  Acute metabolic encephalopathy in the setting of dementia CT head done in the outside facility did not show any acute findings.  His encephalopathy is most likely due to acute illness.  Continue to monitor.  Reorient daily.  Continue donepezil.  Also on Namenda.  Essential hypertension Blood pressure medications on hold.  History of AAA Measuring 5.8 cm.  Followed by vascular surgery.  Has not had any intervention due to other medical issues.  Normocytic anemia Drop in hemoglobin likely dilutional.  No evidence of overt bleeding.  Continue to monitor.  Chronic systolic CHF Echocardiogram done earlier this year showed a EF of 30 to 35%.  Careful with IV fluids.  BNP was noted to be elevated in the outside facility.  But he does not appear to be in CHF at this time.    History of coronary artery disease Last cardiac catheterization was in January 2020.  Severe left main and 2 vessel disease noted.  Only PTCA was performed the RCA.  No stent was placed.  Other disease not amenable to intervention and patient not a candidate for CABG.  Patient is on aspirin and Brilinta.  General surgery requested that Brilinta be held which was held yesterday.  Seems to be stable from a cardiac standpoint.   DVT Prophylaxis: Subcutaneous heparin    Code Status: Full code Family Communication: Discussed with son yesterday. Discussed with son again today. Code status was reviewed. Son will discus with other family and get back to [place]. Patient had previously expressed wish to be  resuscitated as outlined in living will. But Son, who is POA and is an EMT, understands that circumstances have changed with dementia and cardiac issues. Disposition Plan: Management as outlined above.  Mobilize as tolerated.  Wait for acute medical issues to resolve.     LOS: 2 days   [facility] Pager on [email or web address]  08/24/2018, 11:21 AM

## 2018-08-25 ENCOUNTER — Inpatient Hospital Stay (HOSPITAL_COMMUNITY): Payer: Medicare Other

## 2018-08-25 DIAGNOSIS — J9601 Acute respiratory failure with hypoxia: Secondary | ICD-10-CM

## 2018-08-25 DIAGNOSIS — I469 Cardiac arrest, cause unspecified: Secondary | ICD-10-CM

## 2018-08-25 DIAGNOSIS — R579 Shock, unspecified: Secondary | ICD-10-CM

## 2018-08-25 LAB — COMPREHENSIVE METABOLIC PANEL
ALT: 168 U/L — ABNORMAL HIGH (ref 0–44)
AST: 363 U/L — ABNORMAL HIGH (ref 15–41)
Albumin: 1.3 g/dL — ABNORMAL LOW (ref 3.5–5.0)
Alkaline Phosphatase: 150 U/L — ABNORMAL HIGH (ref 38–126)
Anion gap: 22 — ABNORMAL HIGH (ref 5–15)
BUN: 79 mg/dL — ABNORMAL HIGH (ref 8–23)
CO2: 12 mmol/L — ABNORMAL LOW (ref 22–32)
Calcium: 8 mg/dL — ABNORMAL LOW (ref 8.9–10.3)
Chloride: 110 mmol/L (ref 98–111)
Creatinine, Ser: 3.63 mg/dL — ABNORMAL HIGH (ref 0.61–1.24)
GFR calc Af Amer: 18 mL/min — ABNORMAL LOW (ref >60)
GFR calc non Af Amer: 15 mL/min — ABNORMAL LOW (ref >60)
Glucose, Bld: 244 mg/dL — ABNORMAL HIGH (ref 70–99)
Potassium: 4.2 mmol/L (ref 3.5–5.1)
Sodium: 144 mmol/L (ref 135–145)
Total Bilirubin: 1.1 mg/dL (ref 0.3–1.2)
Total Protein: 4.6 g/dL — ABNORMAL LOW (ref 6.5–8.1)

## 2018-08-25 LAB — CBC
HCT: 22.2 % — ABNORMAL LOW (ref 39.0–52.0)
Hemoglobin: 6.3 g/dL — CL (ref 13.0–17.0)
MCH: 25.7 pg — ABNORMAL LOW (ref 26.0–34.0)
MCHC: 28.4 g/dL — ABNORMAL LOW (ref 30.0–36.0)
MCV: 90.6 fL (ref 80.0–100.0)
Platelets: 161 10*3/uL (ref 150–400)
RBC: 2.45 MIL/uL — ABNORMAL LOW (ref 4.22–5.81)
RDW: 16.7 % — ABNORMAL HIGH (ref 11.5–15.5)
WBC: 18.8 10*3/uL — ABNORMAL HIGH (ref 4.0–10.5)
nRBC: 0.4 % — ABNORMAL HIGH (ref 0.0–0.2)

## 2018-08-25 LAB — ABO/RH: ABO/RH(D): O POS

## 2018-08-25 LAB — BLOOD GAS, ARTERIAL
Acid-base deficit: 21.2 mmol/L — ABNORMAL HIGH (ref 0.0–2.0)
Bicarbonate: 8.7 mmol/L — ABNORMAL LOW (ref 20.0–28.0)
FIO2: 100
O2 Saturation: 94.2 %
Patient temperature: 98.6
pCO2 arterial: 44.2 mmHg (ref 32.0–48.0)
pH, Arterial: 6.926 — CL (ref 7.350–7.450)
pO2, Arterial: 132 mmHg — ABNORMAL HIGH (ref 83.0–108.0)

## 2018-08-25 LAB — GLUCOSE, CAPILLARY
Glucose-Capillary: 111 mg/dL — ABNORMAL HIGH (ref 70–99)
Glucose-Capillary: 169 mg/dL — ABNORMAL HIGH (ref 70–99)

## 2018-08-25 LAB — PREPARE RBC (CROSSMATCH)

## 2018-08-25 MED ORDER — NOREPINEPHRINE 4 MG/250ML-% IV SOLN
0.0000 ug/min | INTRAVENOUS | Status: DC
  Administered 2018-08-25 (×2): 40 ug/min via INTRAVENOUS
  Filled 2018-08-25: qty 250

## 2018-08-25 MED ORDER — SODIUM CHLORIDE 0.9 % IV SOLN: 80.0000 mg | Freq: Once | INTRAVENOUS | Status: DC

## 2018-08-25 MED ORDER — FENTANYL BOLUS VIA INFUSION
25.0000 ug | INTRAVENOUS | Status: DC | PRN
  Filled 2018-08-25: qty 25

## 2018-08-25 MED ORDER — MIDAZOLAM HCL 2 MG/2ML IJ SOLN: 1.0000 mg | INTRAMUSCULAR | Status: DC | PRN

## 2018-08-25 MED ORDER — PHENYLEPHRINE HCL-NACL 10-0.9 MG/250ML-% IV SOLN
INTRAVENOUS | Status: AC
  Filled 2018-08-25: qty 250

## 2018-08-25 MED ORDER — ORAL CARE MOUTH RINSE
15.0000 mL | OROMUCOSAL | Status: DC
  Administered 2018-08-25: 15 mL via OROMUCOSAL

## 2018-08-25 MED ORDER — SODIUM BICARBONATE 8.4 % IV SOLN
INTRAVENOUS | Status: AC
  Filled 2018-08-25: qty 150

## 2018-08-25 MED ORDER — SODIUM CHLORIDE 0.9% IV SOLUTION: Freq: Once | INTRAVENOUS | Status: DC

## 2018-08-25 MED ORDER — PHENYLEPHRINE HCL-NACL 10-0.9 MG/250ML-% IV SOLN
0.0000 ug/min | INTRAVENOUS | Status: DC
  Administered 2018-08-25: 100 ug/min via INTRAVENOUS
  Administered 2018-08-25: 400 ug/min via INTRAVENOUS
  Filled 2018-08-25 (×2): qty 250
  Filled 2018-08-25: qty 500
  Filled 2018-08-25: qty 250

## 2018-08-25 MED ORDER — BISACODYL 10 MG RE SUPP: 10.0000 mg | Freq: Every day | RECTAL | Status: DC | PRN

## 2018-08-25 MED ORDER — AMIODARONE LOAD VIA INFUSION
150.0000 mg | Freq: Once | INTRAVENOUS | Status: AC
  Administered 2018-08-25: 04:00:00 150 mg via INTRAVENOUS
  Filled 2018-08-25: qty 83.34

## 2018-08-25 MED ORDER — FENTANYL 2500MCG IN NS 250ML (10MCG/ML) PREMIX INFUSION: 25.0000 ug/h | INTRAVENOUS | Status: DC

## 2018-08-25 MED ORDER — PANTOPRAZOLE SODIUM 40 MG IV SOLR: 40.0000 mg | Freq: Two times a day (BID) | INTRAVENOUS | Status: DC

## 2018-08-25 MED ORDER — CHLORHEXIDINE GLUCONATE 0.12% ORAL RINSE (MEDLINE KIT): 15.0000 mL | Freq: Two times a day (BID) | OROMUCOSAL | Status: DC

## 2018-08-25 MED ORDER — STERILE WATER FOR INJECTION IV SOLN
INTRAVENOUS | Status: DC
  Administered 2018-08-25: 05:00:00 via INTRAVENOUS
  Filled 2018-08-25: qty 9.71

## 2018-08-25 MED ORDER — FAMOTIDINE IN NACL 20-0.9 MG/50ML-% IV SOLN: 20.0000 mg | Freq: Two times a day (BID) | INTRAVENOUS | Status: DC

## 2018-08-25 MED ORDER — ATROPINE SULFATE 1 MG/10ML IJ SOSY
PREFILLED_SYRINGE | INTRAMUSCULAR | Status: AC
  Filled 2018-08-25: qty 10

## 2018-08-25 MED ORDER — FENTANYL CITRATE (PF) 100 MCG/2ML IJ SOLN: 25.0000 ug | Freq: Once | INTRAMUSCULAR | Status: DC

## 2018-08-25 MED ORDER — AMIODARONE HCL IN DEXTROSE 360-4.14 MG/200ML-% IV SOLN: 30.0000 mg/h | INTRAVENOUS | Status: DC

## 2018-08-25 MED ORDER — SODIUM CHLORIDE 0.9 % IV SOLN: INTRAVENOUS | Status: DC | PRN

## 2018-08-25 MED ORDER — AMIODARONE HCL IN DEXTROSE 360-4.14 MG/200ML-% IV SOLN
60.0000 mg/h | INTRAVENOUS | Status: AC
  Administered 2018-08-25 (×2): 60 mg/h via INTRAVENOUS
  Filled 2018-08-25 (×2): qty 200

## 2018-08-25 MED ORDER — SODIUM CHLORIDE 0.9 % IV SOLN
8.0000 mg/h | INTRAVENOUS | Status: DC
  Administered 2018-08-25: 8 mg/h via INTRAVENOUS
  Filled 2018-08-25: qty 80

## 2018-08-25 MED ORDER — SODIUM CHLORIDE 0.9 % IV SOLN
80.0000 mg | Freq: Once | INTRAVENOUS | Status: AC
  Administered 2018-08-25: 80 mg via INTRAVENOUS
  Filled 2018-08-25: qty 80

## 2018-08-25 MED FILL — Medication: Qty: 1 | Status: AC

## 2018-08-27 LAB — BPAM RBC
Blood Product Expiration Date: 202009182359
Blood Product Expiration Date: 202009182359
Unit Type and Rh: 5100
Unit Type and Rh: 5100

## 2018-08-27 LAB — TYPE AND SCREEN
ABO/RH(D): O POS
Antibody Screen: NEGATIVE
Unit division: 0
Unit division: 0

## 2018-08-31 ENCOUNTER — Telehealth: Payer: Self-pay

## 2018-08-31 NOTE — Telephone Encounter (Signed)
Received dc from [facility].   Dc is for burial.  Patient is a patient of Doctor Mariane Masters.   DC will be taken to Pulmonary Unit for signature.  On 09/07/2018 received signed dc back from Doctor Loanne Drilling. I called the funeral home to let them know the dc was mailed to vital records per their request.

## 2018-09-04 ENCOUNTER — Ambulatory Visit: Payer: Medicare Other | Admitting: Vascular Surgery

## 2018-09-04 NOTE — Procedures (Signed)
Central Venous Catheter Insertion Procedure Note [name] [id number] 01-20-1940  Procedure: Insertion of Central Venous Catheter Indications: Drug and/or fluid administration  Procedure Details Consent: Unable to obtain consent because of emergent medical necessity. Time Out: Verified patient identification, verified procedure, site/side was marked, verified correct patient position, special equipment/implants available, medications/allergies/relevent history reviewed, required imaging and test results available.  Performed  Maximum sterile technique was used including antiseptics, cap, gloves, gown, hand hygiene, mask and sheet. Skin prep: Chlorhexidine; local anesthetic administered A antimicrobial bonded/coated triple lumen catheter was placed in the right subclavian vein using the Seldinger technique.  Evaluation Blood flow good Complications: No apparent complications Patient did tolerate procedure well. Chest X-ray ordered to verify placement.  CXR: pending.  Shellia Cleverly September 18, 2018, 3:47 AM

## 2018-09-04 NOTE — Code Documentation (Signed)
  Patient Name: Demoni Haenel.   MRN: [id number]   Date of Birth/ Sex: 04-Dec-1940 , male      Admission Date: 08/04/2018  Attending Provider: Bonnielee Haff, MD  Primary Diagnosis: Acute cholecystitis   Indication: Pt was in his usual state of health until this AM, when he was noted to be PEA. Code blue was subsequently called. At the time of arrival on scene, ACLS protocol was underway.   Technical Description:  - CPR performance duration:  13 minutes  - Was defibrillation or cardioversion used? No   - Was external pacer placed? No  - Was patient intubated pre/post CPR? Yes   Medications Administered: Y = Yes; Blank = No Amiodarone    Atropine    Calcium    Epinephrine  Y  Lidocaine    Magnesium  Y  Norepinephrine    Phenylephrine    Sodium bicarbonate  Y  Vasopressin     Post CPR evaluation:  - Final Status - Was patient successfully resuscitated ? Yes - What is current rhythm? Sinus rhythm - What is current hemodynamic status? Stable  Miscellaneous Information:  - Labs sent, including: CBC, CMP, ABG  - Primary team notified?  Yes  - Family Notified? Yes  - Additional notes/ transfer status: Dr. Juanda Crumble ([facility] hospitalist) resumed care after code and signed out patient to PCCM.     Mitzi Hansen, MD  09-08-2018, 2:50 AM

## 2018-09-04 NOTE — Procedures (Addendum)
Arterial Catheter Insertion Procedure Note [name] Current [id number] Feb 27, 1940  Procedure: Insertion of Arterial Catheter  Indications: Blood pressure monitoring and Frequent blood sampling  Procedure Details Consent: Unable to obtain consent because of altered level of consciousness. Time Out: Verified patient identification, verified procedure, site/side was marked, verified correct patient position, special equipment/implants available, medications/allergies/relevent history reviewed, required imaging and test results available.  Performed 0420  Maximum sterile technique was used including cap, gloves, gown, hand hygiene, mask and sheet. Skin prep: Chlorhexidine; local anesthetic administered 20 gauge catheter was inserted into left radial artery using the Seldinger technique. ULTRASOUND GUIDANCE USED: NO Evaluation Blood flow good; BP tracing good. Complications: No apparent complications.   [name] insertion by RRT [name] and RRT [name] with no apparent complications.   [name] 18-Sep-2018

## 2018-09-04 NOTE — Consult Note (Addendum)
NAME:  [name]., MRN:  [id number], DOB:  February 18, 1940, LOS: 3 ADMISSION DATE:  08/04/2018, CONSULTATION DATE:  September 14, 2018 REFERRING MD:  Triad, CHIEF COMPLAINT:  S/p PEA arrest   Brief History   78 yo M s/p PEA arrest. ROSC achieved after approximately 20 minutes. Patient received 5 Epi, 2 Bicarb before ROSC. Additional 1 Bicarb given after ROSC for ABG pH 6.93  Son is POA History of present illness   45 yo M PMH dementia, CAD, HTN, Kidney mass s/p nephrectomy, AAA (6cm) who presented to [facility] before being transferred to [facility] 08/23/2018 with CC abdominal pain.  Abdominal pain reportedly became 1 week prior to presentation to [facility]. Associated symptoms included lethargy, nausea, decreased PO intake. ED workup reveals mild transaminitis (AST 127 ALT 80 ALK 240), possible small hepatic abscess near gallbladder, Diarrhea and + C.Diff, Acute renal injury with Cr 4.5 BUN 90, Leukocytosis 13.    09/14/2018 Patient noted to have agonal respirations. He became pulseless, PEA arrest. ACLS initiated. Patient intubated. Patient received 5 Epi, 2 Bicarb before ROSC. Estimated downtime 18 minutes. ABG 6.93/ 44.2/132/8.7, additional amp of bicarb given. Transferred to ICU.   Past Medical History  Dementia CAD HTN AAA 5.8cm   Significant Hospital Events   8/20 admitted. SGY consulted, recommended abx treatment at this time 8/22 PEA arrest, ROSC achieved after approx 18 min. Received 5 epi 2 bicarb during arrest. Second Cardiac arrest in ICU after transfer-- initial PEA arrest, received 5 Epi, 3 Bicarb, 1 Calcium, with conversion to shockable rhythm, shock x1, Epi x1 ROSC at 0406. Resultant rhythm Afib   Consults:  SGY PCCM   Procedures:  8/22 ETT>>   Significant Diagnostic Tests:  8/22 CXR >>>   Micro Data:  SARS CoV2- OSH performed negative   Antimicrobials:  8/20 Flagyl >> 8/20 Cefepime >>  8/20 PO Vanc >>   Interim history/subjective:  S/p PEA arrest downtime approx 20 min  with ROSC   Objective   Blood pressure 137/78, pulse 64, temperature 98.2 F (36.8 C), temperature source Oral, resp. rate 16, SpO2 100 %.        Intake/Output Summary (Last 24 hours) at 09-14-2018 0303 Last data filed at 08/24/2018 1831 Gross per 24 hour  Intake 1783.74 ml  Output 850 ml  Net 933.74 ml   There were no vitals filed for this visit.  Examination: General: chronically ill and critically ill appearing older adult M, intubated NAD HENT: NCAT. Poor dentition, ETT secure, trachea midline pink mmm  Lungs: symmetrical chest expansion, no flail chest, no adventitious sounds Cardiovascular: RRR s1s2 no rgm  Abdomen: Soft, flat, ndnt.  Extremities: Symmetrical bulk and tone, some apparent symmetrical muscle atrophy BUE BLE. No obvious joint deformity. No cyanosis, no clubbing  Neuro: Does not follow commands, 40m pupils, Does not localize to pain  GU: Deferred Skin: clean, dry, cool, mild jaundice appearance   Resolved Hospital Problem list     Assessment & Plan:   S/p PEA arrest -ROSC after approx 20 min -ROSC after approx 15 min  P Continue supportive Care Continue mechanical ventilation Pulm hygiene MAP goal > 65 CVC placement  NE for goal ECHO RT to insert A line  Bicarb gtt   Acute Respiratory Failure with hypoxia -in setting of above P Continue MV Pulm Hygiene PAD fent, versed STAT CXR   Shock -septic vs cardiogenic P ECHO as above Continue cefepime, flagyl, PO vanc Supportive care as above   Dementia Acute Metabolic Encephalopathy P  Continue Donezepil, namenda Consider repeat CT head if no neuro improvement  Of note, son is POA  Acute Anemia -Hgb 6.3 from 9.3  -Coffee ground emesis noted by ICU RN upon transfer to ICU P 2 PRBC Follow up H/H after transfusion CT c/a/p when stable, would like to replete PRBC and cive more IVF before going doing (will need contrast CT)  Protonix bolus and gtt  Dc Heparin Start SCD  AAA -5.8cm  P  Will need CT as above to eval possible bleeding   Acute Kidney Injury -likely prerenal  P MAP goal > 65 as above Strict I/O Consider renal US   HTN P Holding antihypertensives in setting of shock   Hepatic Lesion -concerning for abscess on CT P Continue flagyl, cefepime  C. Diff P Enteric precautions Oral vanc   Hypoglycemia P D5 IVF  Atrial fibrilaltion P Amio bolus and IV Continue tele    Best practice:  Diet: NPO Pain/Anxiety/Delirium protocol (if indicated): fentanyl, versed  VAP protocol (if indicated): yes  DVT prophylaxis: SCd GI prophylaxis: protonix Glucose control: D5 Mobility: BR Code Status: Full  Family Communication: son (POA) updated by Dr. Mariane Masters PCCM Disposition: ICU   Labs   CBC: Recent Labs  Lab 08/23/18 0333 08/23/18 1127 08/24/18 0432  WBC 10.8* 9.4 7.7  HGB 9.5* 9.7* 9.3*  HCT 29.3* 29.9* 28.8*  MCV 80.7 80.6 81.1  PLT 152 145* 133*    Basic Metabolic Panel: Recent Labs  Lab 08/23/18 0333 08/23/18 1127 08/24/18 0432  NA 134* 137 138  K 3.2* 2.9* 3.1*  CL 104 105 109  CO2 17* 20* 16*  GLUCOSE 77 76 64*  BUN 96* 93* 90*  CREATININE 4.29* 4.17* 3.72*  CALCIUM 7.9* 8.1* 8.3*   GFR: Estimated Creatinine Clearance: 18.7 mL/min (A) (by C-G formula based on SCr of 3.72 mg/dL (H)). Recent Labs  Lab 08/23/18 0333 08/23/18 1127 08/24/18 0432  WBC 10.8* 9.4 7.7    Liver Function Tests: Recent Labs  Lab 08/23/18 0333 08/23/18 1127 08/24/18 0432  AST 108* 105* 89*  ALT 63* 63* 54*  ALKPHOS 197* 190* 179*  BILITOT 1.1 1.5* 1.5*  PROT 5.9* 6.1* 6.0*  ALBUMIN 1.8* 1.7* 1.7*   Recent Labs  Lab 08/23/18 1127 08/24/18 0432  LIPASE 216* 252*   No results for input(s): AMMONIA in the last 168 hours.  ABG    Component Value Date/Time   PHART 6.926 (LL) September 04, 2018 0240   PCO2ART 44.2 09/04/18 0240   PO2ART 132 (H) 09-04-2018 0240   HCO3 8.7 (L) 2018/09/04 0240   TCO2 25 01/30/2018 2134   ACIDBASEDEF 21.2  (H) September 04, 2018 0240   O2SAT 94.2 2018/09/04 0240     Coagulation Profile: Recent Labs  Lab 08/23/18 1127  INR 1.3*    Cardiac Enzymes: No results for input(s): CKTOTAL, CKMB, CKMBINDEX, TROPONINI in the last 168 hours.  HbA1C: Hgb A1c MFr Bld  Date/Time Value Ref Range Status  01/30/2018 09:36 PM 5.3 4.8 - 5.6 % Final    Comment:    (NOTE) Pre diabetes:          5.7%-6.4% Diabetes:              >6.4% Glycemic control for   <7.0% adults with diabetes     CBG: Recent Labs  Lab 08/24/18 1317 08/24/18 1438 08/24/18 1444 08/24/18 1742 09/04/2018 0001  GLUCAP 64* 72 187* 101* 111*    Review of Systems:   Unable to obtain, patient encephalopathic demented,  intubated, sedated   Past Medical History  He,  has a past medical history of Dementia ([place]), Hypertension, and Myocardial infarction ([place]).   Surgical History    Past Surgical History:  Procedure Laterality Date  . CORONARY/GRAFT ACUTE MI REVASCULARIZATION N/A 01/30/2018   Procedure: Coronary/Graft Acute MI Revascularization;  Surgeon: Martinique, Peter M, MD;  Location: [facility];  Service: Cardiovascular;  Laterality: N/A;  . CYSTOSCOPY WITH RETROGRADE PYELOGRAM, URETEROSCOPY AND STENT PLACEMENT N/A 05/18/2018   Procedure: CYSTOSCOPY;  Surgeon: Lucas Mallow, MD;  Location: [facility];  Service: Urology;  Laterality: N/A;  . CYSTOSCOPY WITH URETEROSCOPY AND STENT PLACEMENT Left 06/11/2018   Procedure: CYSTOSCOPY WITH LEFT  URETEROSCOPY  AND STENT PLACEMENT;  Surgeon: Lucas Mallow, MD;  Location: [facility];  Service: Urology;  Laterality: Left;  . LAPAROSCOPIC NEPHRECTOMY Left 07/11/2018   Procedure: LEFT HAND ASSISTED LAPAROSCOPIC RADICAL NEPHRECTOMY;  Surgeon: Lucas Mallow, MD;  Location: [facility];  Service: Urology;  Laterality: Left;  . LEFT HEART CATH AND CORONARY ANGIOGRAPHY N/A 01/30/2018   Procedure: LEFT HEART CATH AND CORONARY ANGIOGRAPHY;  Surgeon: Martinique, Peter M, MD;  Location: [facility];  Service: Cardiovascular;  Laterality: N/A;  . TRANSURETHRAL RESECTION OF BLADDER TUMOR N/A 05/18/2018   Procedure: TRANSURETHRAL RESECTION OF BLADDER TUMOR (TURBT), CLOT EVACUATION;  Surgeon: Lucas Mallow, MD;  Location: [facility];  Service: Urology;  Laterality: N/A;     Social History   reports that he quit smoking about 30 years ago. His smoking use included cigarettes. He has a 25.00 pack-year smoking history. He has never used smokeless tobacco. He reports previous alcohol use. He reports that he does not use drugs.   Family History   His family history includes Arthritis in his mother and son; Asthma in his son; Depression in his sister; Diabetes in his mother and son; Heart disease in his paternal grandfather; Hypertension in his mother; Obesity in his mother and son; Pulmonary embolism in his sister; Stroke in his father and mother.   Allergies Allergies  Allergen Reactions  . Tamsulosin     Dropped bp too low     Home Medications  Prior to Admission medications   Medication Sig Start Date End Date Taking? Authorizing Provider  aspirin 81 MG chewable tablet Chew 1 tablet (81 mg total) by mouth daily. 02/03/18  Yes Bhagat, Bhavinkumar, PA  atorvastatin (LIPITOR) 80 MG tablet TAKE ONE TABLET BY MOUTH DAILY AT 6 PM Patient taking differently: Take 80 mg by mouth every evening.  07/31/18  Yes Bhagat, Bhavinkumar, PA  BRILINTA 90 MG TABS tablet Take 90 mg by mouth 2 (two) times daily. 08/03/18  Yes [provider]  donepezil (ARICEPT) 10 MG tablet TAKE ONE TABLET BY MOUTH AT BEDTIME. Patient taking differently: Take 10 mg by mouth at bedtime.  07/09/18  Yes Stacks, Cletus Gash, MD  esomeprazole (NEXIUM) 20 MG capsule Take 20 mg by mouth daily.   Yes [provider]  furosemide (LASIX) 20 MG tablet Take 20 mg by mouth every Monday, Wednesday, and Friday.    Yes [provider]  loratadine (CLARITIN) 10 MG tablet Take 10 mg by mouth at bedtime.   Yes [provider]  memantine (NAMENDA) 10 MG tablet TAKE ONE TABLET BY MOUTH DAILY. Patient taking differently: Take 10 mg by mouth daily.  07/09/18  Yes Stacks, Cletus Gash, MD  metoprolol succinate (TOPROL-XL) 25 MG 24 hr tablet TAKE ONE TABLET BY  MOUTH DAILY Patient taking differently: Take 25 mg by mouth daily.  07/31/18  Yes Bhagat, Bhavinkumar, PA  nitroGLYCERIN (NITROSTAT) 0.4 MG SL tablet Place 1 tablet (0.4 mg total) under the tongue every 5 (five) minutes as needed. Patient taking differently: Place 0.4 mg under the tongue every 5 (five) minutes as needed for chest pain.  02/02/18  Yes Leanor Kail, PA     Critical care time: 55 min     Case seen and discussed with NP Bowser and patient examined, case reviewed.  Suspect intrabdominal catastrophe.  Discussed by phone with patient's son, he is aware of the situation.  Patient needs a CT of abdomen but too unstable currently to go to CT.  Prognosis grim.  Dr Laurelyn Sickle MD   Eliseo Gum MSN, AGACNP-BC [place] [phone] If no answer, [phone] Sep 13, 2018, 3:04 AM

## 2018-09-04 NOTE — Procedures (Signed)
Intubation Procedure Note [name] [id number] 1940-06-13  Procedure: Intubation Indications: Respiratory insufficiency  Procedure Details Consent: Unable to obtain consent because of emergent medical necessity. Time Out: Verified patient identification, verified procedure, site/side was marked, verified correct patient position, special equipment/implants available, medications/allergies/relevent history reviewed, required imaging and test results available.  Performed  Maximum sterile technique was used including antiseptics, gloves, gown, hand hygiene and mask.  MAC and 4    Evaluation Hemodynamic Status: UNSTABLE; O2 sats: UNSTABLE Patient's Current Condition: unstable Complications: No apparent complications Patient did tolerate procedure well. Chest X-ray ordered to verify placement.  CXR: pending.  On arrival CODE BLUE initiated, ACLS protocol followed. Agonal respirations and Cyanosis noted on arrival. Patient was intubated by self [name], RRT). Difficult airway was unanticipated, but anterior airway was noted under Direct Laryngoscopy. Mac 4 Blade was used. Patient was intubated w/o complications. Positive color change noted. BBS to auscultation were confirmed. Equal Coarse Breath Sounds noted.  Leigh Aurora, B.S, RRT, RCP September 08, 2018, [phone]

## 2018-09-04 NOTE — Progress Notes (Signed)
0210 This RN received call from telemetry that pt's heart rate was going 140-150's so this RN went to check pt. Pt vomited once and was still responsive per NT upon checking. This RN gave zofran PRN 1 mg IV and notified MD about situation. Pt responds to pain but starting to look really bad. Called RRT.  0218 Pt became cyanotic and was having agonal respirations then became pulseless.  PEA arrest, ACLS initiated and this RN informed MD.  0222 Code blue team arrived and took over.   0230 Code blue still going on, this RN informed family about situation and will call again for updates. ROSC achieved after approx 18 mins.  0254 This RN called family back to inform that pt was successfully resuscitated and will be transferred to [id number] and this RN went down with pt transfer to 104M and gave report.

## 2018-09-04 NOTE — Progress Notes (Signed)
Patient time of death occurred at 63. Pronounced by Nira Conn, RN and Marylyn Ishihara, RN

## 2018-09-04 NOTE — Progress Notes (Signed)
ABG drawn, abnormal results given to Eliseo Gum NP.

## 2018-09-04 NOTE — Progress Notes (Signed)
PCCM Code Documentation Date of service 09-12-2018 Admission date 08/26/2018  Patient transferred to ICU folllowing PEA arrest with ROSC earlier this shift. In ICU, patient with subsequent PEA arrest  Code Blue Initiated at [phone]  -CPR initiated -Received Epinephrine x5 -Received Sodium Bicarb x3 -Received Calcium Chloride x1 -Shockable Rhythm noted at 0406 , Received 120J Defibrillation  ROSC achieved at 0408, result rhythm Atrial Fibrillation with RVR  -amiodarone bolus and IV ordered     Additional Critical Care Time 15 minutes   Eliseo Gum MSN, AGACNP-BC [phone] If no answer, [phone]/09/12, 4:21 AM

## 2018-09-04 NOTE — Progress Notes (Signed)
Patient was manually bagged ventilated 100% FiO2, throughout the duration of the CODE. Once ROSC was acheived patient was then transferred to ICU. Report given to unit RRT. CCM at bedside.

## 2018-09-04 NOTE — Progress Notes (Signed)
   09/16/2018 0145  Clinical Encounter Type  Visited With Patient  Visit Type Code;Initial  Referral From Nurse  Consult/Referral To Chaplain  Chaplain responded to Code Blue. Chaplain checked in with RN. No needs at present time. Spiritual Care will follow up as needed.

## 2018-09-04 DEATH — deceased

## 2018-09-27 ENCOUNTER — Telehealth: Payer: Self-pay

## 2018-09-27 NOTE — Telephone Encounter (Signed)
Received dc from [facility].  This is the second copy. The first one got lost in the mail.  The dc will be taken to [facility] for signature.

## 2018-09-28 NOTE — Telephone Encounter (Signed)
09/28/2018 Received signed D/C back from Dr. Loanne Drilling

## 2018-10-04 NOTE — Discharge Summary (Signed)
Hospital course:  Patient is a 78 year old male admitted to medical history of dementia hypertension coronary artery disease with generalized abdominal pain admitted to [facility] 08/19/2018.  He underwent surgical evaluation and presumptively had cholecystitis.  This improved with conservative therapy antibiotics and hydration.  On the day prior to his arrest he clinically seem to be improved.  Early the morning of 2022-09-04 patient had 13-minute PEA arrest.  He was resuscitated from this and transferred to the ICU.  He was profoundly hypotensive despite aggressive vasopressor and IV hydration along with aggressive resuscitative efforts.  After I believe 3 PEA arrests, with vasopressors as noted above mechanical ventilation, multiple rounds of epinephrine and bicarbonate given during resuscitation we were unable to obtain a consistent blood pressure.  Situation was discussed with family and I explained to them that his chances of needing full recovery were extremely low.  Both his son and sister voiced understanding.  I discussed the case with his sister at bedside.  At that point it was decided by the family to make the patient a DNR.  We did our best to keep him comfortable.  Patient ultimately expired 04-Sep-2018 at 0756.  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

## 2018-11-04 NOTE — Progress Notes (Signed)
Entered chart for Code [employer].

## 2021-02-05 IMAGING — CT CT ABDOMEN AND PELVIS WITHOUT AND WITH CONTRAST
3 of 12 series · 10 of 46 positions shown, 16 images · IV contrast (Isovue)
Comparison: None.

CLINICAL DATA: Massive gross hematuria.

EXAM:
CT ABDOMEN AND PELVIS WITHOUT AND WITH CONTRAST
TECHNIQUE: Multidetector CT imaging of the abdomen and pelvis was performed
following the standard protocol before and following the bolus
administration of intravenous contrast.
CONTRAST:  100mL OMNIPAQUE IOHEXOL 300 MG/ML  SOLN

[Series 3: axial post · axial · 0.85mm/px · z∈[+1067,+1387]mm · 4 of 108 slices shown, 9 images]
[im 22/108  soft-tissue]
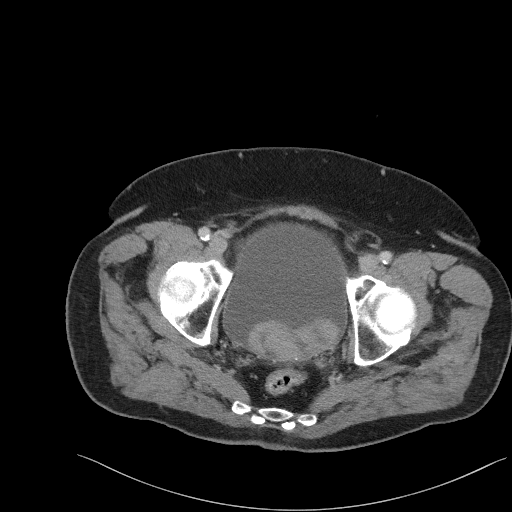
[im 22/108  lung]
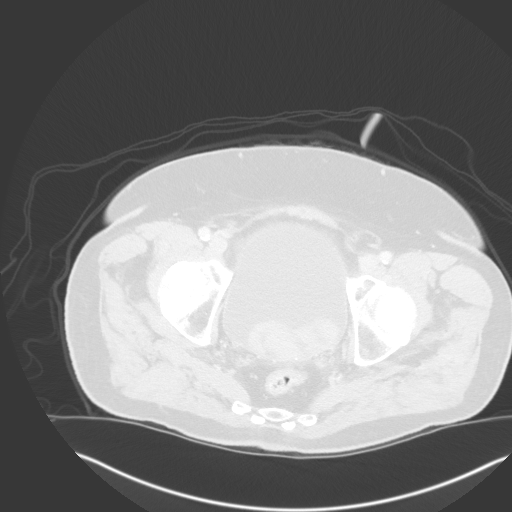
[im 22/108  bone]
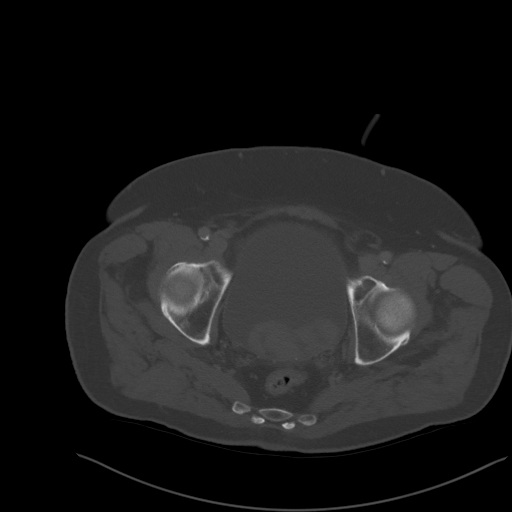
[im 43/108  soft-tissue]
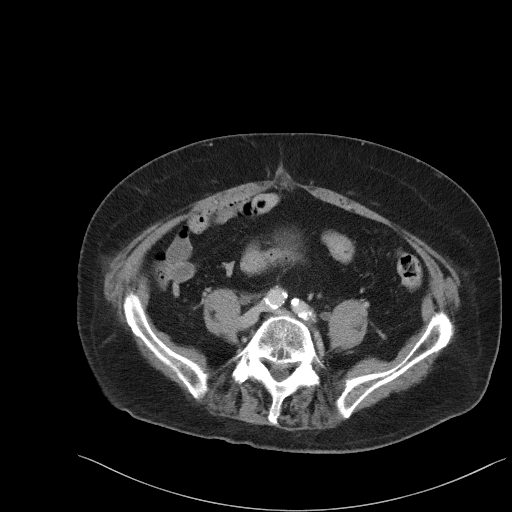
[im 43/108  lung]
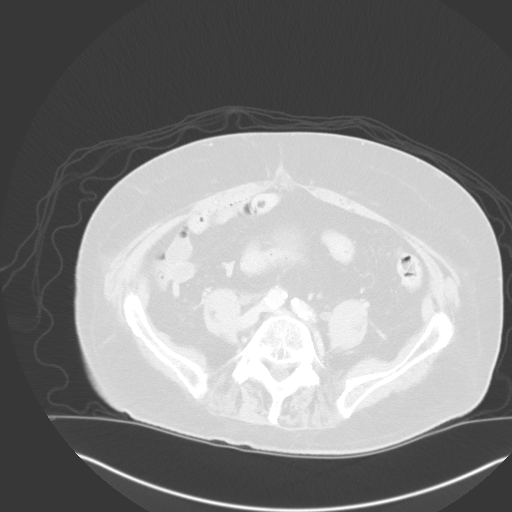
[im 65/108  soft-tissue]
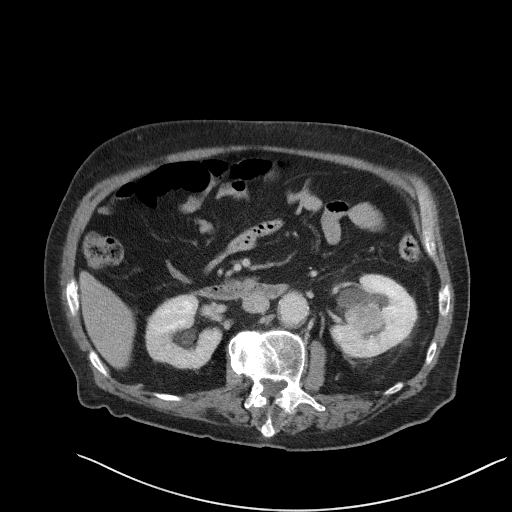
[im 65/108  lung]
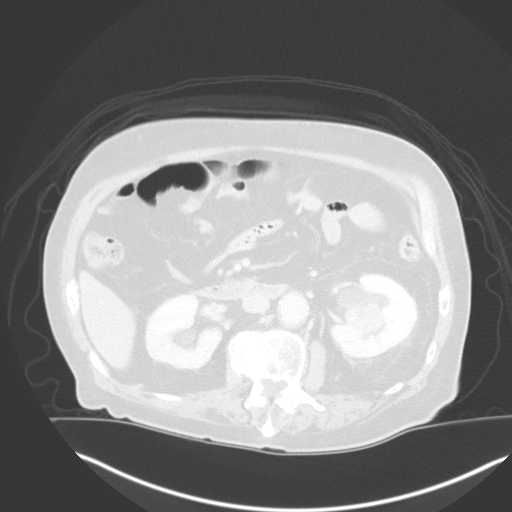
[im 86/108  soft-tissue]
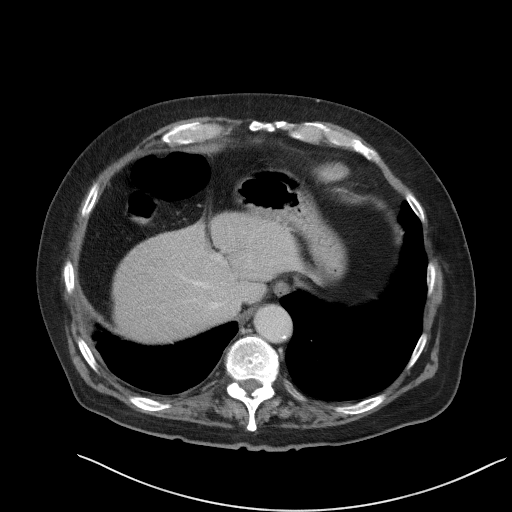
[im 86/108  lung]
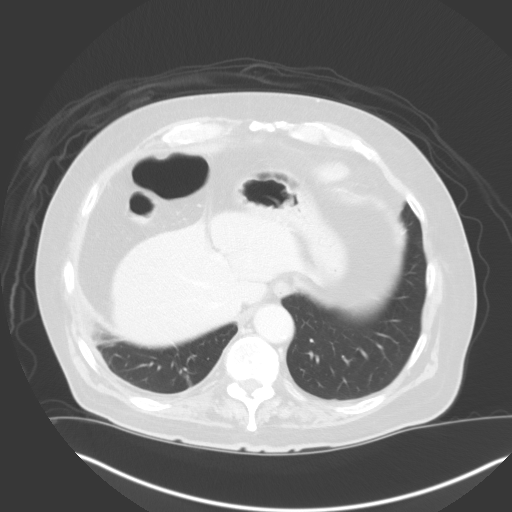

[Series 4: axial delay · axial · delayed · 0.86mm/px · z∈[+1075,+1380]mm · 4 of 103 slices shown]
[im 21/103  soft-tissue]
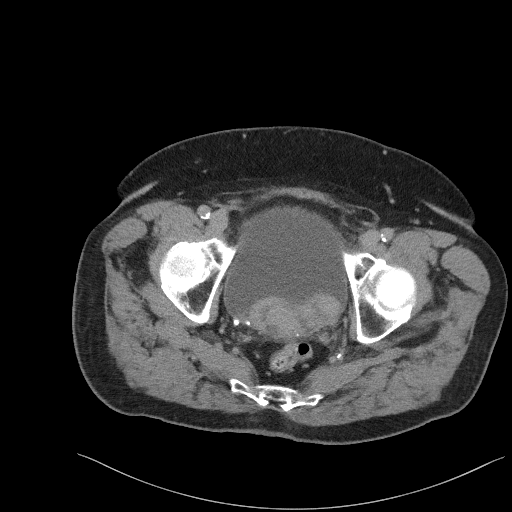
[im 41/103  soft-tissue]
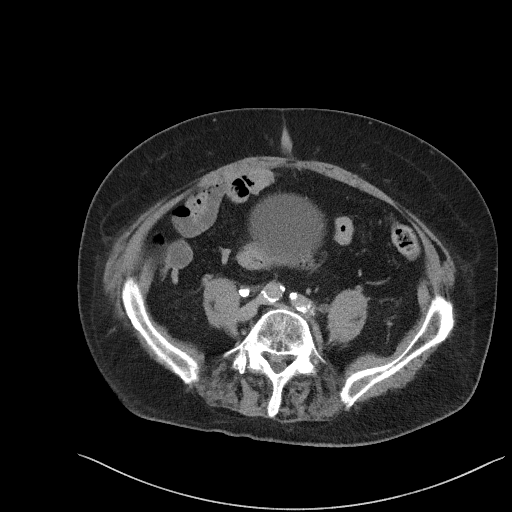
[im 62/103  soft-tissue]
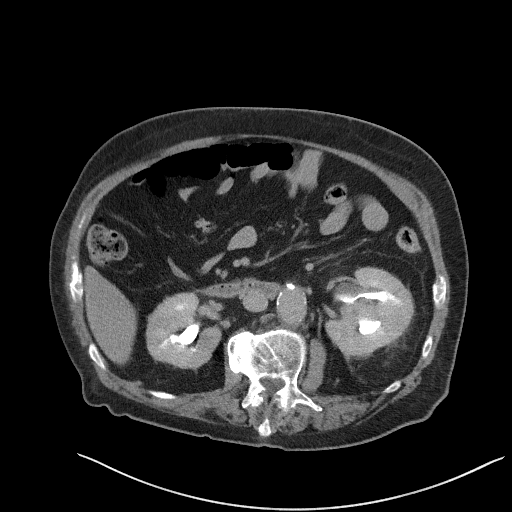
[im 82/103  soft-tissue]
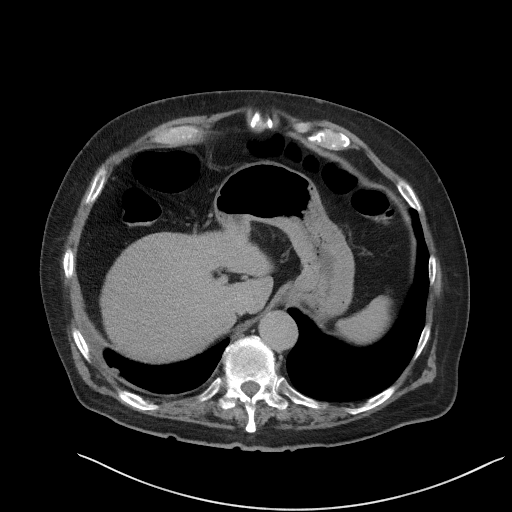

[Series 7: coronal pre · coronal · non-contrast · 0.90mm/px · 2 of 101 slices shown, 3 images]
[im 34/101  soft-tissue]
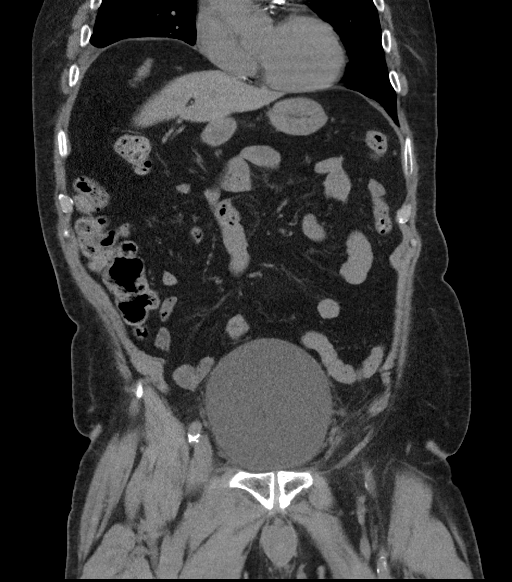
[im 34/101  bone]
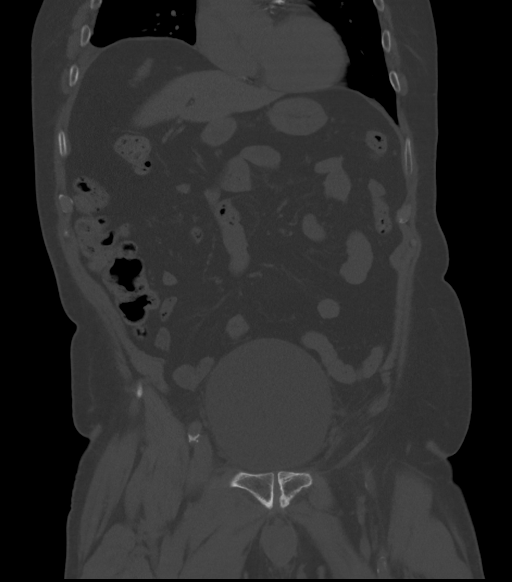
[im 67/101  soft-tissue]
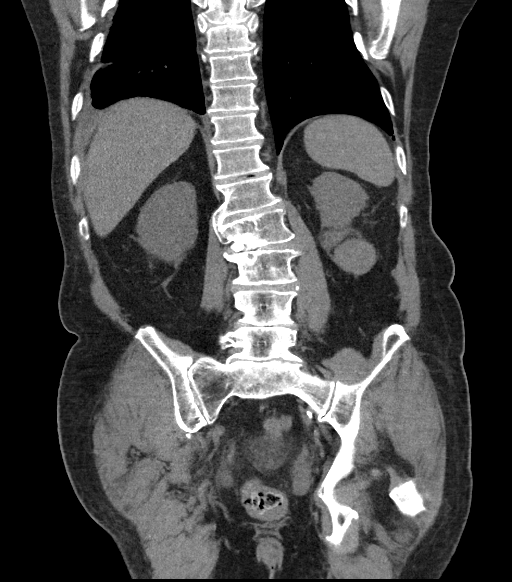

[10 of 46 positions shown; findings below may reference images not displayed]

FINDINGS: Lower chest: No significant pulmonary nodules or acute consolidative
airspace disease. Pleural-parenchymal scarring at the right lung
base with smooth right pleural thickening and trace right pleural
effusion. Coronary atherosclerosis.

Hepatobiliary: Normal liver size. No liver mass. Tiny layering
gallstones in otherwise normal gallbladder. No biliary ductal
dilatation.

Pancreas: Normal, with no mass or duct dilation.

Spleen: Normal size. No mass.

Adrenals/Urinary Tract: Normal adrenals. Prominent layering
hyperdense material in the bladder measuring up to 8.2 x 3.2 cm
dimension (series 2/image 85) without appreciable enhancement, most
compatible with blood products. Mild bilateral hydroureteronephrosis
to the level of ureterovesical junctions bilaterally. No right renal
stones. Nonobstructing 5 mm lower left renal stone. No ureteral
stones. There is an avidly enhancing 6.7 x 4.0 x 5.5 cm renal
cortical mass in the posterior mid to lower left kidney (series
3/image 47), which demonstrates invasion of the lower left renal
sinus/left renal collecting system and perinephric fat invasion. No
additional renal cortical masses. On delayed imaging, there is no
urothelial wall thickening and there are no filling defects in the
opacified portions of the right renal collecting system or right
ureter. The left renal collecting system and left ureter are largely
unopacified, precluding evaluation for urothelial lesions. Prominent
distention of the bladder with no discrete enhancing bladder mass.

Stomach/Bowel: Normal non-distended stomach. Normal caliber small
bowel with no small bowel wall thickening. Normal appendix. Minimal
sigmoid diverticulosis, no large bowel wall thickening or
significant pericolonic fat stranding.

Vascular/Lymphatic: Atherosclerotic abdominal aorta with 6.0 cm
infrarenal abdominal aortic aneurysm. Patent portal, splenic,
hepatic and renal veins. No evidence of renal vein tumor thrombus.
No pathologically enlarged lymph nodes in the abdomen or pelvis.

Reproductive: Mild prostatomegaly.

Other: No pneumoperitoneum, ascites or focal fluid collection.

Musculoskeletal: No aggressive appearing focal osseous lesions.
Moderate thoracolumbar spondylosis.
IMPRESSION: 1. Avidly enhancing 6.7 x 4.0 x 5.5 cm renal cortical mass in the
posterior mid to lower left kidney, compatible with renal cell
carcinoma. This mass demonstrates direct invasion of the lower left
renal collecting system and of the perinephric fat. No evidence of
renal vein tumor thrombus.
2. No findings of metastatic disease in the abdomen or pelvis.
3. Prominent nonenhancing hyperdense material layering in the
bladder compatible with blood products. Prominent bladder
distention, suggesting bladder outlet obstruction.
4. Mild bilateral hydroureteronephrosis probably due to bladder
outlet obstruction.
5. Mild prostatomegaly.
6. Infrarenal 6.0 cm Abdominal Aortic Aneurysm (BR52L-6Z9.2).
Vascular surgery referral/consultation is recommended due to
increased risk for rupture. This recommendation follows ACR
consensus guidelines: White Paper of the ACR Incidental Findings
Committee II on Vascular Findings. [HOSPITAL] [id number];
[DATE].
7.  Aortic Atherosclerosis (BR52L-QVJ.J).

These results will be called to the ordering clinician or
representative by the [HOSPITAL] at the imaging location.

## 2021-05-16 IMAGING — DX PORTABLE CHEST - 1 VIEW
1 series · 1 of 1 positions shown · non-contrast
Comparison: August 22, 2018

CLINICAL DATA: Post CPR.  Intubation.

EXAM:
PORTABLE CHEST 1 VIEW

[chest ap]
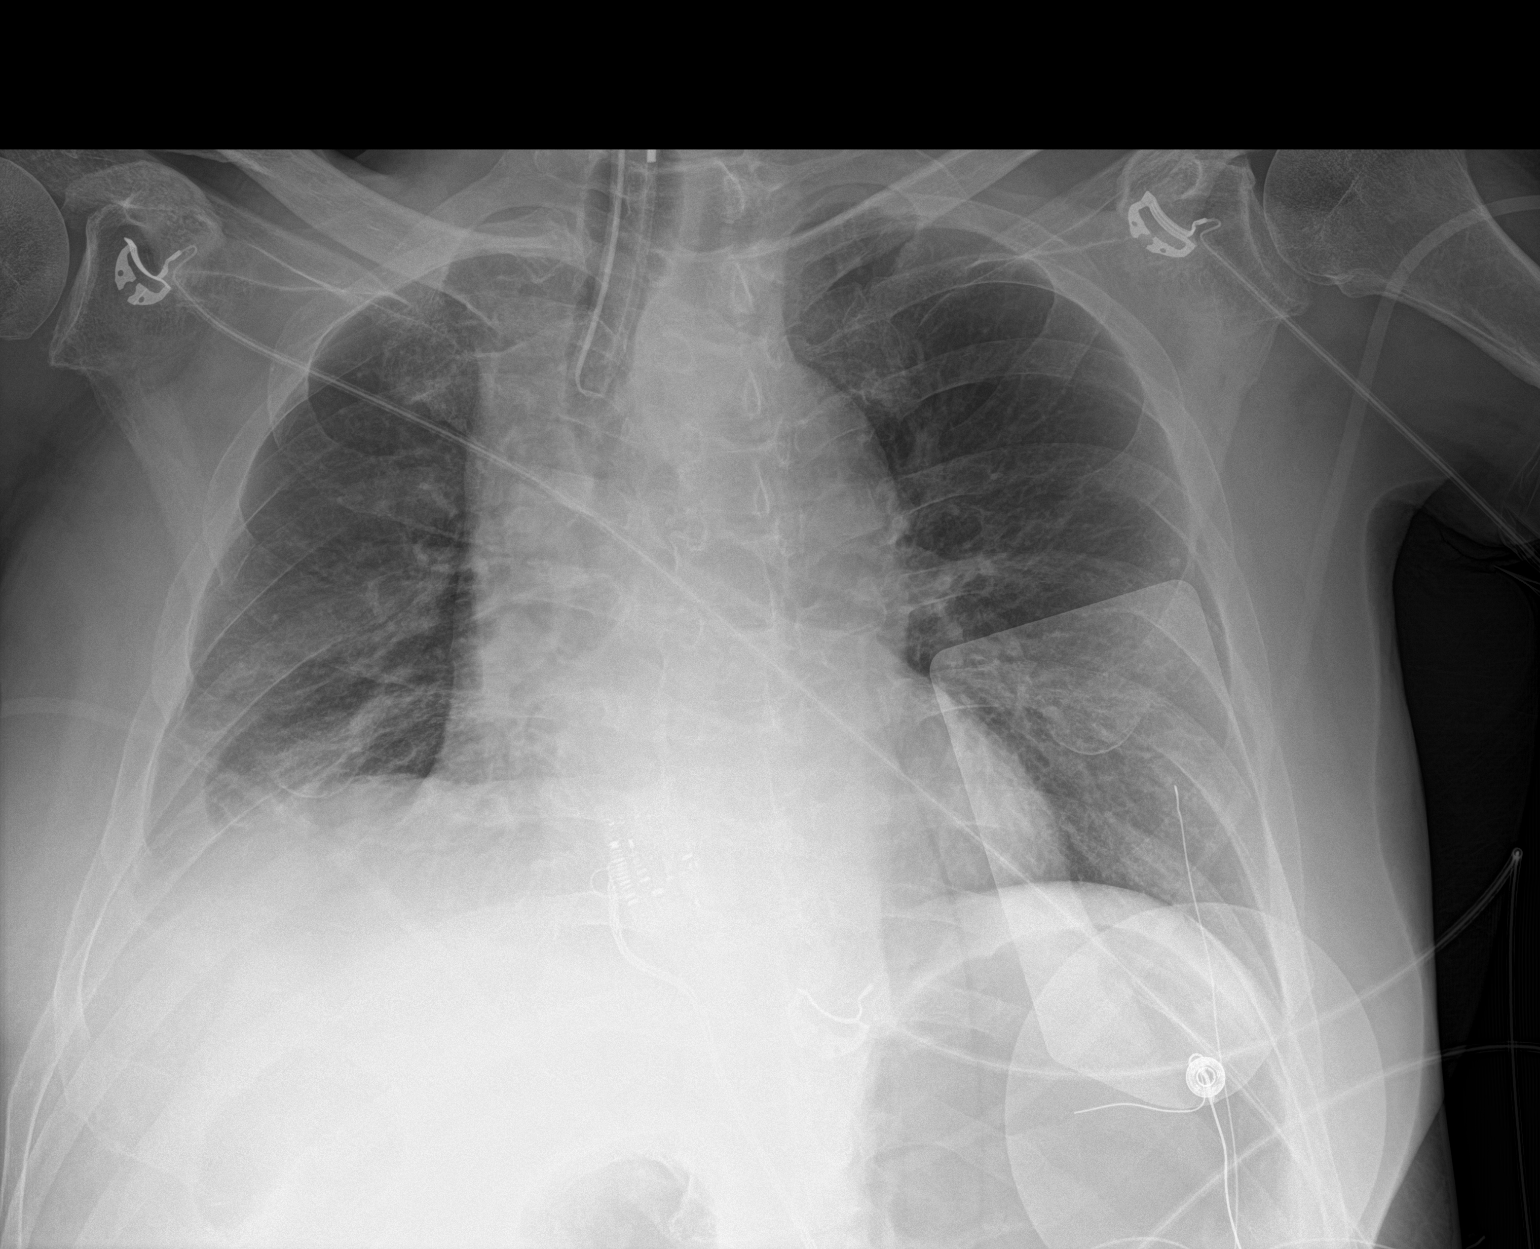

[1 of 1 positions shown; findings below may reference images not displayed]

FINDINGS: The endotracheal tube terminates above the carina by approximately
3.9 cm. There is no pneumothorax. Again noted is blunting of the
right costophrenic angle favored to represent a combination of both
atelectasis and a small right-sided pleural effusion. There is no
definite displaced rib fracture. The heart size is somewhat
enlarged.
IMPRESSION: 1. Endotracheal tube as above.
2. No pneumothorax.
3. No displaced rib fracture.
4. Persistent blunting of the right costophrenic angle favored to
represent a combination of both atelectasis and a right-sided
pleural effusion. An underlying infiltrate is not excluded.
